# Patient Record
Sex: Male | Born: 1970 | Race: Black or African American | Hispanic: No | Marital: Married | Smoking: Current every day smoker
Health system: Southern US, Community
[De-identification: ages and names within clinical notes are randomized; demographics above are authoritative.]

## PROBLEM LIST (undated history)

## (undated) DIAGNOSIS — E785 Hyperlipidemia, unspecified: Secondary | ICD-10-CM

## (undated) DIAGNOSIS — K219 Gastro-esophageal reflux disease without esophagitis: Secondary | ICD-10-CM

## (undated) DIAGNOSIS — M722 Plantar fascial fibromatosis: Secondary | ICD-10-CM

## (undated) DIAGNOSIS — I2510 Atherosclerotic heart disease of native coronary artery without angina pectoris: Secondary | ICD-10-CM

## (undated) DIAGNOSIS — Z9289 Personal history of other medical treatment: Secondary | ICD-10-CM

## (undated) HISTORY — DX: Gastro-esophageal reflux disease without esophagitis: K21.9

## (undated) HISTORY — PX: OTHER SURGICAL HISTORY: SHX169

## (undated) HISTORY — DX: Hyperlipidemia, unspecified: E78.5

## (undated) HISTORY — DX: Atherosclerotic heart disease of native coronary artery without angina pectoris: I25.10

## (undated) HISTORY — DX: Plantar fascial fibromatosis: M72.2

## (undated) HISTORY — PX: CORONARY STENT PLACEMENT: SHX1402

## (undated) HISTORY — DX: Personal history of other medical treatment: Z92.89

---

## 2004-04-27 ENCOUNTER — Emergency Department (HOSPITAL_COMMUNITY): Admission: EM | Admit: 2004-04-27 | Discharge: 2004-04-27 | Payer: Self-pay

## 2006-09-11 ENCOUNTER — Emergency Department (HOSPITAL_COMMUNITY): Admission: EM | Admit: 2006-09-11 | Discharge: 2006-09-11 | Payer: Self-pay | Admitting: Emergency Medicine

## 2011-02-06 ENCOUNTER — Emergency Department (HOSPITAL_COMMUNITY): Payer: Self-pay

## 2011-02-06 ENCOUNTER — Inpatient Hospital Stay (HOSPITAL_COMMUNITY)
Admission: AD | Admit: 2011-02-06 | Discharge: 2011-02-08 | DRG: 249 | Disposition: A | Discharge status: Home / Self Care | Payer: Self-pay | Source: Other Acute Inpatient Hospital | Attending: Internal Medicine | Admitting: Internal Medicine

## 2011-02-06 ENCOUNTER — Emergency Department (HOSPITAL_COMMUNITY)
Admission: EM | Admit: 2011-02-06 | Discharge: 2011-02-06 | Disposition: A | Discharge status: Other Acute Inpatient Hospital | Payer: Self-pay | Source: Home / Self Care | Attending: Emergency Medicine | Admitting: Emergency Medicine

## 2011-02-06 DIAGNOSIS — F17200 Nicotine dependence, unspecified, uncomplicated: Secondary | ICD-10-CM | POA: Diagnosis present

## 2011-02-06 DIAGNOSIS — Z01810 Encounter for preprocedural cardiovascular examination: Secondary | ICD-10-CM

## 2011-02-06 DIAGNOSIS — I200 Unstable angina: Secondary | ICD-10-CM | POA: Diagnosis present

## 2011-02-06 DIAGNOSIS — F1210 Cannabis abuse, uncomplicated: Secondary | ICD-10-CM | POA: Diagnosis present

## 2011-02-06 DIAGNOSIS — Z01812 Encounter for preprocedural laboratory examination: Secondary | ICD-10-CM

## 2011-02-06 DIAGNOSIS — R079 Chest pain, unspecified: Secondary | ICD-10-CM

## 2011-02-06 DIAGNOSIS — I2510 Atherosclerotic heart disease of native coronary artery without angina pectoris: Principal | ICD-10-CM | POA: Diagnosis present

## 2011-02-06 LAB — COMPREHENSIVE METABOLIC PANEL
Alkaline Phosphatase: 71 U/L (ref 39–117)
CO2: 30 mEq/L (ref 19–32)
Chloride: 99 mEq/L (ref 96–112)
Creatinine, Ser: 1.42 mg/dL (ref 0.4–1.5)
GFR calc Af Amer: >60 mL/min (ref >60)
GFR calc non Af Amer: 55 mL/min — ABNORMAL LOW (ref >60)
Glucose, Bld: 81 mg/dL (ref 70–99)
Total Bilirubin: 0.6 mg/dL (ref 0.3–1.2)
Total Protein: 7.7 g/dL (ref 6.0–8.3)

## 2011-02-06 LAB — RAPID URINE DRUG SCREEN, HOSP PERFORMED: Tetrahydrocannabinol: POSITIVE — AB

## 2011-02-06 LAB — DIFFERENTIAL
Basophils Absolute: 0.1 10*3/uL (ref 0.0–0.1)
Basophils Relative: 1 % (ref 0–1)
Eosinophils Relative: 1 % (ref 0–5)
Lymphocytes Relative: 36 % (ref 12–46)
Monocytes Relative: 7 % (ref 3–12)
Neutrophils Relative %: 55 % (ref 43–77)

## 2011-02-06 LAB — CBC
HCT: 46 % (ref 39.0–52.0)
Hemoglobin: 15.5 g/dL (ref 13.0–17.0)
MCH: 30.9 pg (ref 26.0–34.0)
MCV: 91.8 fL (ref 78.0–100.0)
RBC: 5.01 MIL/uL (ref 4.22–5.81)

## 2011-02-06 LAB — LIPID PANEL
Cholesterol: 165 mg/dL (ref 0–200)
HDL: 65 mg/dL (ref >39)
Total CHOL/HDL Ratio: 2.5 RATIO
Triglycerides: 49 mg/dL (ref <150)
VLDL: 10 mg/dL (ref 0–40)

## 2011-02-06 LAB — HEMOGLOBIN A1C: Hgb A1c MFr Bld: 5.5 % (ref <5.7)

## 2011-02-06 LAB — CARDIAC PANEL(CRET KIN+CKTOT+MB+TROPI)
CK, MB: 1.7 ng/mL (ref 0.3–4.0)
Relative Index: 0.8 (ref 0.0–2.5)
Total CK: 215 U/L (ref 7–232)

## 2011-02-07 DIAGNOSIS — I2510 Atherosclerotic heart disease of native coronary artery without angina pectoris: Secondary | ICD-10-CM

## 2011-02-07 LAB — BASIC METABOLIC PANEL
BUN: 11 mg/dL (ref 6–23)
CO2: 27 mEq/L (ref 19–32)
Calcium: 8.4 mg/dL (ref 8.4–10.5)
Chloride: 103 mEq/L (ref 96–112)
GFR calc Af Amer: >60 mL/min (ref >60)
GFR calc non Af Amer: >60 mL/min (ref >60)
Glucose, Bld: 90 mg/dL (ref 70–99)
Potassium: 3.9 mEq/L (ref 3.5–5.1)

## 2011-02-07 LAB — CBC
MCH: 30.2 pg (ref 26.0–34.0)
Platelets: 206 10*3/uL (ref 150–400)
RBC: 4.96 MIL/uL (ref 4.22–5.81)
RDW: 14.7 % (ref 11.5–15.5)

## 2011-02-07 LAB — TSH: TSH: 2.392 u[IU]/mL (ref 0.350–4.500)

## 2011-02-07 LAB — PROTIME-INR: INR: 0.98 (ref 0.00–1.49)

## 2011-02-07 LAB — CARDIAC PANEL(CRET KIN+CKTOT+MB+TROPI)
Relative Index: 0.7 (ref 0.0–2.5)
Troponin I: 0.03 ng/mL (ref 0.00–0.06)

## 2011-02-08 DIAGNOSIS — I200 Unstable angina: Secondary | ICD-10-CM

## 2011-02-08 LAB — CBC
MCHC: 33.6 g/dL (ref 30.0–36.0)
RDW: 14.6 % (ref 11.5–15.5)

## 2011-02-08 LAB — BASIC METABOLIC PANEL
CO2: 28 mEq/L (ref 19–32)
Chloride: 106 mEq/L (ref 96–112)
Potassium: 3.8 mEq/L (ref 3.5–5.1)

## 2011-02-08 NOTE — Procedures (Signed)
NAME:  Sean Morris, Sean Morris NO.:  [id number]  MEDICAL RECORD NO.:  [id number]           PATIENT TYPE:  O  LOCATION:  6531                         FACILITY:  [facility]  PHYSICIAN:  Verne Carrow, MDDATE OF BIRTH:  October 30, 1971  DATE OF PROCEDURE:  02/07/2011 DATE OF DISCHARGE:                           CARDIAC CATHETERIZATION   PRIMARY CARDIOLOGIST:  Bevelyn Buckles. Bensimhon, MD  PROCEDURES PERFORMED: 1. Left heart catheterization 2. Selective coronary angiography. 3. Left ventricular angiogram. 4. Intravascular ultrasound of the mid left anterior descending     artery. 5. PTCA with placement of a bare-metal stent in the mid left anterior     descending artery. 6. Placement of Angio-Seal femoral artery closure device in the right     femoral artery.  OPERATOR:  Verne Carrow, MD  INDICATIONS:  This is a 40 year old African American male with a history of tobacco abuse and former illicit drug abuse who presents to the hospital with complaints of chest pain with minimal exertion.  Cardiac enzymes were negative.  Diagnostic catheterization was arranged for today.  PROCEDURE IN DETAIL:  The patient was brought to the Main Cardiac Catheterization Laboratory after signing informed consent for the procedure.  The right groin was prepped and draped in a sterile fashion. Lidocaine 1% was used for local anesthesia.  A 5-French sheath was inserted into the right femoral artery without difficulty.  Standard diagnostic catheters were used to perform selective coronary angiography.  A pigtail catheter was used to perform a left ventricular angiogram.  At this point we carefully looked at the hazy stenosis in the mid left anterior descending artery.  This appeared to be hazy and severely stenotic in several views.  Because of the uncertainty, we elected to proceed to perform intravascular ultrasound of this lesion. Sheath was upsized to a 6-French sheath.  A 6-French  XBLAD 3.5 guiding catheter was used to selectively engage the left main artery.  The patient was given a bolus of Angiomax and drip was started.  He had been previously loaded with Effient 60 mg x1 yesterday.  When ACT was greater than 200, we passed a cougar intracoronary wire down the length of the left anterior descending artery.  We then passed an intravascular ultrasound camera over the wire.  Imaging was performed of the mid stenosis.  The patient was found to have severe plaque disease in the mid vessel.  At this point we elected to proceed intervention of the severe stenosis in the mid left anterior descending artery.  A 2.5 x 12- mm balloon was used to predilate the lesion.  A 3.5 x 15-mm Vision bare- metal stent was carefully positioned in the midvessel and was deployed without difficulty.  A 4.0 x 12-mm noncompliant balloon was placed inside the stent was inflated without difficulty.  The stenosis was taken from 90% down to 0%.  The patient tolerated the procedure well. There were no immediate complications.  The Angiomax was stopped here in the cath lab.  The patient was taken to the recovery area in stable condition.  HEMODYNAMIC FINDINGS:  Central aortic pressure 135/75.  Left ventricular pressure 142/7/21.  ANGIOGRAPHIC FINDINGS: 1. The left main coronary artery had no evidence of disease. 2. The left anterior descending was a large vessel that coursed to the     apex and gave off 3 moderate-sized diagonal branches.  The mid LAD     just after the takeoff of the first diagonal branch had a hazy     severe 90% stenosis.  The diagonal branches had mild plaque     disease. 3. The circumflex artery gave off a moderate-sized first obtuse     marginal branch that had no disease.  The AV groove circumflex     became a small-caliber vessel after the takeoff of a large second     obtuse marginal branch.  There was no disease in this vessel. 4. The right coronary artery is a  large dominant vessel with no     disease. 5. Left ventricular angiogram was performed in the RAO projection that     showed normal left ventricular systolic function with ejection     fraction of 50-55%.  IMPRESSION: 1. Single-vessel coronary artery disease. 2. Successful percutaneous transluminal coronary angioplasty with     placement of bare-metal stent in the mid left anterior descending     artery. 3. Normal left ventricular systolic function.  RECOMMENDATIONS:  The patient should be continued on aspirin and Effient for at least 1 month.  We will continue his beta-blocker and his statin as written.  We will watch him closely night and discharge him home tomorrow if he is stable.     Verne Carrow, MD     CM/MEDQ  D:  02/07/2011  T:  02/08/2011  Job:  [id number]  cc:   Bevelyn Buckles. Bensimhon, MD  Electronically Signed by Verne Carrow MD on 02/08/2011 10:01:25 AM

## 2011-02-09 ENCOUNTER — Emergency Department (HOSPITAL_COMMUNITY)
Admission: EM | Admit: 2011-02-09 | Discharge: 2011-02-09 | Disposition: A | Discharge status: Home / Self Care | Payer: Self-pay | Attending: Emergency Medicine | Admitting: Emergency Medicine

## 2011-02-09 DIAGNOSIS — I724 Aneurysm of artery of lower extremity: Secondary | ICD-10-CM

## 2011-02-09 DIAGNOSIS — R229 Localized swelling, mass and lump, unspecified: Secondary | ICD-10-CM | POA: Insufficient documentation

## 2011-02-09 DIAGNOSIS — IMO0002 Reserved for concepts with insufficient information to code with codable children: Secondary | ICD-10-CM | POA: Insufficient documentation

## 2011-02-09 DIAGNOSIS — I2510 Atherosclerotic heart disease of native coronary artery without angina pectoris: Secondary | ICD-10-CM | POA: Insufficient documentation

## 2011-02-09 DIAGNOSIS — Y840 Cardiac catheterization as the cause of abnormal reaction of the patient, or of later complication, without mention of misadventure at the time of the procedure: Secondary | ICD-10-CM | POA: Insufficient documentation

## 2011-02-09 LAB — CBC
HCT: 42.9 % (ref 39.0–52.0)
Hemoglobin: 14.7 g/dL (ref 13.0–17.0)
MCH: 30.8 pg (ref 26.0–34.0)
MCHC: 34.3 g/dL (ref 30.0–36.0)
MCV: 89.9 fL (ref 78.0–100.0)
Platelets: 194 10*3/uL (ref 150–400)
RBC: 4.77 MIL/uL (ref 4.22–5.81)
RDW: 14.4 % (ref 11.5–15.5)
WBC: 9.5 10*3/uL (ref 4.0–10.5)

## 2011-02-16 NOTE — Discharge Summary (Signed)
NAME:  Sean Morris, Sean Morris NO.:  [id number]  MEDICAL RECORD NO.:  [id number]           PATIENT TYPE:  O  LOCATION:  6531                         FACILITY:  [facility]  PHYSICIAN:  Verne Carrow, MDDATE OF BIRTH:  11-19-1970  DATE OF ADMISSION:  02/06/2011 DATE OF DISCHARGE:  02/08/2011                              DISCHARGE SUMMARY   PRIMARY CARDIOLOGIST:  Verne Carrow, MD  DISCHARGE DIAGNOSIS:  Unstable angina.  SECONDARY DIAGNOSES: 1. Coronary artery disease status post successful percutaneous     coronary intervention and bare-metal stenting to left anterior     descending (coronary) artery this admission. 2. Ongoing tobacco abuse. 3. Ongoing marijuana abuse. 4. History of hemorrhoids.  ALLERGIES:  No known drug allergies.  PROCEDURES: 1. Left heart cardiac catheterization revealing a 90% hazy stenosis in     the mid left anterior descending (coronary) artery with otherwise     nonobstructive coronary artery disease.  Ejection fraction was 50-     55%. 2. Successful percutaneous coronary intervention and stenting of the     mid left anterior descending (coronary) artery with placement of a     3.5 x 15-mm Vision bare-metal stent.  HISTORY OF PRESENT ILLNESS:  A 40 year old male without prior cardiac history who was in his usual state of health until several days prior to admission who began to experience exertional dyspnea that worsened on the day of admission.  He presented to the [facility] ED on February 06, 2011, where ECG was nonacute and point-of-care markers were negative. He was admitted for evaluation.  HOSPITAL COURSE:  The patient subsequently ruled out for MI.  History was felt to be concerning for unstable angina and decision was made to pursue cardiac catheterization.  Diagnostic catheterization on February 07, 2011, revealed 90% hazy stenosis in the mid LAD with otherwise nonobstructive disease and normal LV function.  The LAD was  successfully stented with a 3.5 x 15-mm Vision bare-metal stent.  The patient tolerated this procedure well and has been maintained on aspirin, Effient, and high-dose statin therapy.  He was initially placed on beta- blocker therapy, but we have discontinued this in the setting of baseline bradycardia.  The patient has been counseled on the importance of smoking cessation, is also been seen by Cardiac Rehab with outpatient referral made.  We have worked with case management to obtain Effient assistance and the patient will be discharged home today in good condition.  DISCHARGE LABORATORIES:  Hemoglobin 13.6, hematocrit 40.5, WBC 7.9, platelets 188, INR is 0.98.  Sodium 139, potassium 3.8, chloride 106, CO2 28, BUN 6, creatinine 0.95, glucose 87.  Total bilirubin 0.6, alkaline phosphatase 71, AST 34, ALT 21, total protein 7.7, albumin 4.4, calcium 8.3.  Hemoglobin A1c 5.5.  CK 217, MB 1.5, troponin-I 0.03. Total cholesterol 165, triglycerides 49, HDL 65, LDL 90.  TSH 2.392. Urine drug screen is positive for THC.  MRSA screen was negative.  DISPOSITION:  The patient will be discharged home today in good condition.  FOLLOWUP PLANS AND APPOINTMENTS:  We have arranged the patient to follow up with Dr. Clifton James on Mar 03, 2011, at 3:45 p.m.  DISCHARGE MEDICATIONS: 1. Aspirin 81 mg daily. 2. Nitroglycerin 0.4 mg sublingual p.r.n. chest pain. 3. Pravachol 80 mg at bedtime. 4. Prasugrel 10 mg daily.  OUTSTANDING LABORATORY STUDIES:  The patient will require followup lipids and LFTs in 6-8 weeks given new statin therapy.  DURATION OF DISCHARGE ENCOUNTER:  35 minutes including physician time.     Nicolasa Ducking, ANP   ______________________________ Verne Carrow, MD    CB/MEDQ  D:  02/08/2011  T:  02/09/2011  Job:  [id number]  Electronically Signed by Nicolasa Ducking ANP on 02/14/2011 03:04:15 PM Electronically Signed by Verne Carrow MD on 02/16/2011 01:58:15  PM

## 2011-03-01 ENCOUNTER — Encounter: Payer: Self-pay | Admitting: Cardiovascular Disease

## 2011-03-01 NOTE — H&P (Signed)
NAME:  Sean Morris, Sean Morris NO.:  [id number]  MEDICAL RECORD NO.:  [id number]           PATIENT TYPE:  E  LOCATION:  WLED                         FACILITY:  [facility]  PHYSICIAN:  Bevelyn Buckles. Avangeline Stockburger, MDDATE OF BIRTH:  1971-01-31  DATE OF ADMISSION:  02/06/2011 DATE OF DISCHARGE:                             HISTORY & PHYSICAL   PRIMARY CARE PHYSICIAN:  None.  CARDIOLOGIST:  None.  REASON FOR ADMISSION:  Chest pain concerning for unstable angina.  HISTORY OF PRESENT ILLNESS:  Sean Morris is a 40 year old male with a history of ongoing tobacco use.  He denies any history of known coronary artery disease, hypertension, hyperlipidemia or diabetes.  He really has not followed up with a doctor for many years, except to say that he goes to the [facility] clinic once a year for his routine checkup.  He endorses a several-day history of exertional dyspnea and chest pain. He said he had the first episode on Monday when he was walking to the bathroom; this was just very brief and resolved.  However, since that time he has had multiple episodes including on Thursday during sexual intercourse with his girlfriend.  He did not say anything at that time. He recovered from this after a few minutes.  Today, he was walking in from his car when he got central chest pressure and diaphoretic.  He said the discomfort radiated to his neck and he could not catch his breath.  He sat down on the bed for about 5 to 10 minutes and it finally resolved.  He told his girlfriend.  She suggested he come to the ER.  He came to the ER today.  His chest is pain free.  His EKG was normal.  His labs are currently pending.  REVIEW OF SYSTEMS:  On review of systems he denies any drug use except for occasional marijuana.  He does smoke about 6 cigarettes a day as well as 3 thin cigars.  He says he has occasional hemorrhoidal bleeding but nothing severe.  No recent fevers or chills.  No focal neurologic symptoms.   No claudication.  Remainder of review of systems, all systems are negative except for HPI and problem list.  PROBLEM LIST: 1. Hemorrhoids. 2. Tobacco use, ongoing.  MEDICATIONS:  None.  SOCIAL HISTORY:  He previously worked with the [employer] but stopped working with them in March for unclear reasons. He smokes cigarettes about 6 cigarettes a day and 3 cigars a day.  He says he drinks alcohol just on social occasions.  Denies IV drug use or cocaine.  Does endorse occasional marijuana use.  FAMILY HISTORY:  Mother is alive at age 79.  She has a history of diabetes.  No known heart disease.  Father is 35.  He thinks he is in good health but does not know for sure.  He denies any family history of premature coronary artery disease.  He does have 3 brothers and 1 sister all of whom he says are healthy.  PHYSICAL EXAMINATION:  GENERAL:  He is well-appearing in no acute distress.  VITAL SIGNS:  Respirations are  unlabored.  Blood pressure was 138/83 with a heart rate of 73.  Saturations are 96% on room air. HEENT:  Normal.  NECK:  Supple.  No JVD.  Carotid are 2+ bilaterally without bruits.  There is no lymphadenopathy or thyromegaly.  HEART: PMI is nondisplaced.  It is regular with no murmurs, rubs or gallops. LUNGS:  Clear.  ABDOMEN:  Soft, nontender, nondistended.  No hepatosplenomegaly.  No bruits.  No masses.  Good bowel sounds. EXTREMITIES:  Warm with no cyanosis, clubbing or edema.  There are good distal pulses.  No rash.  NEUROLOGIC:  Alert and oriented x3.  Cranial nerves II-XII are intact.  Moves all four extremities without difficulty.  Affect is pleasant.  EKG shows normal sinus rhythm at a rate of 70.  No acute ST-T wave abnormalities.  He does have small Q-waves in the high lateral leads of uncertain significance.  Labs are currently pending.  ASSESSMENT: 1. Chest pain, concerning for unstable angina. 2. Abnormal EKG with small Q-waves in the  high lateral leads.  No     acute ST-T wave abnormalities. 3. Tobacco use, ongoing. 4. Occasional alcohol use.  PLAN/DISCUSSION:  I have reviewed the situation with Sean Morris and his girlfriend.  I think his chest pain is very concerning for unstable angina.  Will admit him to rule out myocardial infarction with serial cardiac markers.  He will be treated with aspirin, beta-blocker, nitroglycerin, statin, Effient, and Lovenox.  The risks and indications of cath were discussed at length.  These include allergic reaction, renal dysfunction, stroke, heart attack, vascular damage, bleeding and death.  He agrees to proceed with cardiac catheterization in the morning.     Bevelyn Buckles. Eligah Anello, MD     DRB/MEDQ  D:  02/06/2011  T:  02/06/2011  Job:  [id number]  Electronically Signed by Arvilla Meres MD on 03/01/2011 07:14:47 PM

## 2011-03-03 ENCOUNTER — Encounter: Payer: Self-pay | Admitting: Cardiovascular Disease

## 2011-03-10 ENCOUNTER — Telehealth: Payer: Self-pay | Admitting: Cardiology

## 2011-03-10 NOTE — Telephone Encounter (Signed)
Pt needs previstatin 40mg  qd/hs and  Prasugrel 10mg  qd called into [facility] on [address]

## 2011-03-11 ENCOUNTER — Telehealth: Payer: Self-pay | Admitting: Cardiovascular Disease

## 2011-03-11 NOTE — Telephone Encounter (Signed)
Advised patient he needs to take his Effient. He is currently in the process of applying for the orange card. I will leave samples at the front for him along with the assistance program application.

## 2011-03-11 NOTE — Telephone Encounter (Signed)
Went to phar to pick up Effient and it was not called in. Pt has rescheduled for June appt.  Does he need to go without Effient until then or do you want him to take ASA instead.  [facility] is [facility] on [address].

## 2011-04-05 ENCOUNTER — Encounter: Payer: Self-pay | Admitting: Cardiovascular Disease

## 2011-04-05 ENCOUNTER — Ambulatory Visit (INDEPENDENT_AMBULATORY_CARE_PROVIDER_SITE_OTHER): Payer: Self-pay | Admitting: Cardiovascular Disease

## 2011-04-05 VITALS — BP 125/74 | HR 53 | Resp 12 | Ht 72.0 in | Wt 145.0 lb

## 2011-04-05 DIAGNOSIS — I2510 Atherosclerotic heart disease of native coronary artery without angina pectoris: Secondary | ICD-10-CM

## 2011-04-05 NOTE — Patient Instructions (Signed)
Your physician recommends that you schedule a follow-up appointment in: 6 months with Dr. McAlhany.  

## 2011-04-05 NOTE — Assessment & Plan Note (Signed)
Stable post PCI April, bare metal stent LAD. Will continue ASA 81 mg once daily. He will continue Effient 10 mg once daily for 6 more weeks and will then stop. Continue statin.

## 2011-04-05 NOTE — Progress Notes (Signed)
History of Present Illness:39 yo AAM with history of tobacco abuse and newly diagnosed CAD admitted to [facility] 02/07/11 with c/o chest pain and found to have severe mid LAD stenosis. A 3.5 x 15 mm Vision bare metal stent was placed after IVUS confirmed the severity of the lesion. The other vessels were free of disease. LV function was normal.  He is here today for hospital follow up. He is feeling well. No chest pain, SOB, palpitations, near syncope or syncope.   Past Medical History  Diagnosis Date  . CAD (coronary artery disease)     s/p stent  . Hemorrhoids     Past Surgical History  Procedure Date  . Coronary stent placement     Current Outpatient Prescriptions  Medication Sig Dispense Refill  . aspirin 81 MG tablet Take 81 mg by mouth daily.        . prasugrel (EFFIENT) 10 MG TABS 1 tab po qd       . pravastatin (PRAVACHOL) 40 MG tablet Take 40 mg by mouth daily.          No Known Allergies  History   Social History  . Marital Status: Single    Spouse Name: N/A    Number of Children: N/A  . Years of Education: N/A   Occupational History  . Not on file.   Social History Main Topics  . Smoking status: Former Games developer  . Smokeless tobacco: Not on file  . Alcohol Use: Yes     social  . Drug Use: Yes    Special: Marijuana  . Sexually Active: Not on file   Other Topics Concern  . Not on file   Social History Narrative  . No narrative on file    Family History  Problem Relation Age of Onset  . Diabetes      Review of Systems:  As stated in the HPI and otherwise negative.   BP 125/74  Pulse 53  Resp 12  Ht 6' (1.829 m)  Wt 145 lb (65.772 kg)  BMI 19.67 kg/m2  Physical Examination: General: Well developed, well nourished, NAD HEENT: OP clear, mucus membranes moist SKIN: warm, dry. No rashes. Neuro: No focal deficits Musculoskeletal: Muscle strength 5/5 all ext Psychiatric: Mood and affect normal Neck: No JVD, no carotid bruits, no  thyromegaly, no lymphadenopathy. Lungs:Clear bilaterally, no wheezes, rhonci, crackles Cardiovascular: Regular rate and rhythm. No murmurs, gallops or rubs. Abdomen:Soft. Bowel sounds present. Non-tender.  Extremities: No lower extremity edema. Pulses are 2 + in the bilateral DP/PT.  Cardiac Cath 02/07/11:

## 2011-04-14 ENCOUNTER — Telehealth: Payer: Self-pay | Admitting: Cardiovascular Disease

## 2011-04-14 NOTE — Telephone Encounter (Signed)
Patient states that his right leg is numb but his foot his warm to the touch with no discoloration. His leg is not painful but he is concerned b/c his cardiac catheterization was done through his right groin. Explained to him that I do not think this is a problem and may be a pinched nerve but I will route to Dr. Clifton James to review. His groin site looks good with a small hematoma that is unchanged from discharge.

## 2011-04-14 NOTE — Telephone Encounter (Signed)
Patient is aware of test/lab results.  

## 2011-04-14 NOTE — Telephone Encounter (Signed)
Pt calling re concerns with leg, numbness and tingling

## 2011-04-14 NOTE — Telephone Encounter (Signed)
I agree. Probably just some femoral nerve irritation or totally unrelated to the cath. If it numbness persists, he should call and let us know. Thanks, chris

## 2011-05-06 ENCOUNTER — Emergency Department (HOSPITAL_COMMUNITY)
Admission: EM | Admit: 2011-05-06 | Discharge: 2011-05-06 | Disposition: A | Discharge status: Home / Self Care | Payer: Self-pay | Attending: Emergency Medicine | Admitting: Emergency Medicine

## 2011-05-06 DIAGNOSIS — L02619 Cutaneous abscess of unspecified foot: Secondary | ICD-10-CM | POA: Insufficient documentation

## 2011-05-06 DIAGNOSIS — L03039 Cellulitis of unspecified toe: Secondary | ICD-10-CM | POA: Insufficient documentation

## 2011-05-06 DIAGNOSIS — E7800 Pure hypercholesterolemia, unspecified: Secondary | ICD-10-CM | POA: Insufficient documentation

## 2011-05-08 ENCOUNTER — Emergency Department (HOSPITAL_COMMUNITY)
Admission: EM | Admit: 2011-05-08 | Discharge: 2011-05-08 | Disposition: A | Discharge status: Home / Self Care | Payer: Self-pay | Attending: Emergency Medicine | Admitting: Emergency Medicine

## 2011-05-08 DIAGNOSIS — E7800 Pure hypercholesterolemia, unspecified: Secondary | ICD-10-CM | POA: Insufficient documentation

## 2011-05-08 DIAGNOSIS — L03039 Cellulitis of unspecified toe: Secondary | ICD-10-CM | POA: Insufficient documentation

## 2011-05-08 DIAGNOSIS — Z09 Encounter for follow-up examination after completed treatment for conditions other than malignant neoplasm: Secondary | ICD-10-CM | POA: Insufficient documentation

## 2011-05-08 DIAGNOSIS — L02619 Cutaneous abscess of unspecified foot: Secondary | ICD-10-CM | POA: Insufficient documentation

## 2011-06-15 ENCOUNTER — Telehealth: Payer: Self-pay | Admitting: Cardiovascular Disease

## 2011-06-15 NOTE — Telephone Encounter (Signed)
Pt wants to know since completing the Effient, does he need to continue the ASA?  Please call him with directions.

## 2011-06-15 NOTE — Telephone Encounter (Signed)
Informed patient to continue taking Aspirin 81 mg daily.

## 2011-10-03 ENCOUNTER — Ambulatory Visit (INDEPENDENT_AMBULATORY_CARE_PROVIDER_SITE_OTHER): Payer: Self-pay | Admitting: Cardiovascular Disease

## 2011-10-03 ENCOUNTER — Encounter: Payer: Self-pay | Admitting: Cardiovascular Disease

## 2011-10-03 ENCOUNTER — Encounter: Payer: Self-pay | Admitting: *Deleted

## 2011-10-03 VITALS — BP 124/75 | HR 66 | Ht 72.0 in | Wt 149.0 lb

## 2011-10-03 DIAGNOSIS — I2510 Atherosclerotic heart disease of native coronary artery without angina pectoris: Secondary | ICD-10-CM

## 2011-10-03 NOTE — Assessment & Plan Note (Signed)
Stable. He is on an ASA and a statin. Will check lipids and LFTs. No changes.

## 2011-10-03 NOTE — Progress Notes (Signed)
   History of Present Illness:40 yo AAM with history of tobacco abuse and CAD here today for cardiac follow up. He was admitted to [facility] 02/07/11 with c/o chest pain and found to have severe mid LAD stenosis. A 3.5 x 15 mm Vision bare metal stent was placed after IVUS confirmed the severity of the lesion. The other vessels were free of disease. LV function was normal. He is here today for follow up. He is feeling well. No chest pain, SOB, palpitations, near syncope or syncope.    Past Medical History  Diagnosis Date  . CAD (coronary artery disease)     s/p 3.5 x 15 mm bare metal stent mid LAD April 2012  . Hemorrhoids     Past Surgical History  Procedure Date  . Coronary stent placement   . Plantar fasciitis surgery     Current Outpatient Prescriptions  Medication Sig Dispense Refill  . aspirin 81 MG tablet Take 81 mg by mouth daily.        . Multiple Vitamins-Minerals (MULTIVITAMIN PO) Take by mouth daily.        . pravastatin (PRAVACHOL) 40 MG tablet Take 40 mg by mouth daily.          No Known Allergies  History   Social History  . Marital Status: Single    Spouse Name: N/A    Number of Children: N/A  . Years of Education: N/A   Occupational History  . Not on file.   Social History Main Topics  . Smoking status: Former Games developer  . Smokeless tobacco: Not on file  . Alcohol Use: Yes     social  . Drug Use: Yes    Special: Marijuana  . Sexually Active: Not on file   Other Topics Concern  . Not on file   Social History Narrative  . No narrative on file    Family History  Problem Relation Age of Onset  . Diabetes      Review of Systems:  As stated in the HPI and otherwise negative.   BP 124/75  Pulse 66  Ht 6' (1.829 m)  Wt 149 lb (67.586 kg)  BMI 20.21 kg/m2  Physical Examination: General: Well developed, well nourished, NAD HEENT: OP clear, mucus membranes moist SKIN: warm, dry. No rashes. Neuro: No focal deficits Musculoskeletal:  Muscle strength 5/5 all ext Psychiatric: Mood and affect normal Neck: No JVD, no carotid bruits, no thyromegaly, no lymphadenopathy. Lungs:Clear bilaterally, no wheezes, rhonci, crackles Cardiovascular: Regular rate and rhythm. No murmurs, gallops or rubs. Abdomen:Soft. Bowel sounds present. Non-tender.  Extremities: No lower extremity edema. Pulses are 2 + in the bilateral DP/PT.

## 2011-10-03 NOTE — Patient Instructions (Signed)
Your physician wants you to follow-up in: 12 months.  You will receive a reminder letter in the mail two months in advance. If you don't receive a letter, please call our office to schedule the follow-up appointment.  Your physician recommends that you return for fasting lab work later this week--Lipid and Liver profile.

## 2011-10-05 ENCOUNTER — Other Ambulatory Visit (INDEPENDENT_AMBULATORY_CARE_PROVIDER_SITE_OTHER): Payer: Self-pay | Admitting: *Deleted

## 2011-10-05 DIAGNOSIS — I2510 Atherosclerotic heart disease of native coronary artery without angina pectoris: Secondary | ICD-10-CM

## 2011-10-05 LAB — HEPATIC FUNCTION PANEL
ALT: 25 U/L (ref 0–53)
AST: 31 U/L (ref 0–37)
Albumin: 4 g/dL (ref 3.5–5.2)
Alkaline Phosphatase: 75 U/L (ref 39–117)
Total Protein: 7 g/dL (ref 6.0–8.3)

## 2011-10-05 LAB — LIPID PANEL
Cholesterol: 147 mg/dL (ref 0–200)
HDL: 61.5 mg/dL (ref >39.00)
Total CHOL/HDL Ratio: 2
Triglycerides: 38 mg/dL (ref 0.0–149.0)

## 2011-10-06 ENCOUNTER — Other Ambulatory Visit: Payer: Self-pay | Admitting: *Deleted

## 2012-04-26 ENCOUNTER — Encounter: Payer: Self-pay | Admitting: Internal Medicine

## 2012-04-26 ENCOUNTER — Ambulatory Visit (INDEPENDENT_AMBULATORY_CARE_PROVIDER_SITE_OTHER): Payer: 59 | Admitting: Internal Medicine

## 2012-04-26 ENCOUNTER — Other Ambulatory Visit (INDEPENDENT_AMBULATORY_CARE_PROVIDER_SITE_OTHER): Payer: 59

## 2012-04-26 VITALS — BP 108/74 | HR 56 | Temp 98.1°F | Resp 16 | Ht 72.0 in | Wt 146.5 lb

## 2012-04-26 DIAGNOSIS — Z23 Encounter for immunization: Secondary | ICD-10-CM

## 2012-04-26 DIAGNOSIS — Z0000 Encounter for general adult medical examination without abnormal findings: Secondary | ICD-10-CM

## 2012-04-26 DIAGNOSIS — I2510 Atherosclerotic heart disease of native coronary artery without angina pectoris: Secondary | ICD-10-CM

## 2012-04-26 DIAGNOSIS — K921 Melena: Secondary | ICD-10-CM

## 2012-04-26 DIAGNOSIS — E7800 Pure hypercholesterolemia, unspecified: Secondary | ICD-10-CM | POA: Insufficient documentation

## 2012-04-26 LAB — LIPID PANEL
Cholesterol: 166 mg/dL (ref 0–200)
LDL Cholesterol: 85 mg/dL (ref 0–99)
VLDL: 15.8 mg/dL (ref 0.0–40.0)

## 2012-04-26 LAB — CBC WITH DIFFERENTIAL/PLATELET
Basophils Absolute: 0.1 10*3/uL (ref 0.0–0.1)
Eosinophils Relative: 2.3 % (ref 0.0–5.0)
HCT: 48.3 % (ref 39.0–52.0)
Hemoglobin: 15.8 g/dL (ref 13.0–17.0)
Lymphs Abs: 3.1 10*3/uL (ref 0.7–4.0)
MCV: 91.8 fl (ref 78.0–100.0)
Monocytes Absolute: 0.6 10*3/uL (ref 0.1–1.0)
Neutro Abs: 5 10*3/uL (ref 1.4–7.7)
Platelets: 234 10*3/uL (ref 150.0–400.0)
RDW: 16.3 % — ABNORMAL HIGH (ref 11.5–14.6)

## 2012-04-26 LAB — COMPREHENSIVE METABOLIC PANEL
ALT: 19 U/L (ref 0–53)
Alkaline Phosphatase: 76 U/L (ref 39–117)
Creatinine, Ser: 1.1 mg/dL (ref 0.4–1.5)
Sodium: 138 mEq/L (ref 135–145)
Total Bilirubin: 0.7 mg/dL (ref 0.3–1.2)
Total Protein: 7.1 g/dL (ref 6.0–8.3)

## 2012-04-26 LAB — URINALYSIS, ROUTINE W REFLEX MICROSCOPIC
Bilirubin Urine: NEGATIVE
Leukocytes, UA: NEGATIVE
Nitrite: NEGATIVE
Total Protein, Urine: NEGATIVE

## 2012-04-26 LAB — TSH: TSH: 1.31 u[IU]/mL (ref 0.35–5.50)

## 2012-04-26 MED ORDER — ROSUVASTATIN CALCIUM 5 MG PO TABS: 5.0000 mg | ORAL_TABLET | Freq: Every day | ORAL | Status: DC

## 2012-04-26 NOTE — Assessment & Plan Note (Signed)
Exam done, vaccines were updated, labs ordered, pt ed material was given 

## 2012-04-26 NOTE — Assessment & Plan Note (Signed)
Start crestor, recheck FLP today

## 2012-04-26 NOTE — Patient Instructions (Signed)
Health Maintenance, Males A healthy lifestyle and preventative care can promote health and wellness.  Maintain regular health, dental, and eye exams.   Eat a healthy diet. Foods like vegetables, fruits, whole grains, low-fat dairy products, and lean protein foods contain the nutrients you need without too many calories. Decrease your intake of foods high in solid fats, added sugars, and salt. Get information about a proper diet from your caregiver, if necessary.   Regular physical exercise is one of the most important things you can do for your health. Most adults should get at least 150 minutes of moderate-intensity exercise (any activity that increases your heart rate and causes you to sweat) each week. In addition, most adults need muscle-strengthening exercises on 2 or more days a week.    Maintain a healthy weight. The body mass index (BMI) is a screening tool to identify possible weight problems. It provides an estimate of body fat based on height and weight. Your caregiver can help determine your BMI, and can help you achieve or maintain a healthy weight. For adults 20 years and older:   A BMI below 18.5 is considered underweight.   A BMI of 18.5 to 24.9 is normal.   A BMI of 25 to 29.9 is considered overweight.   A BMI of 30 and above is considered obese.   Maintain normal blood lipids and cholesterol by exercising and minimizing your intake of saturated fat. Eat a balanced diet with plenty of fruits and vegetables. Blood tests for lipids and cholesterol should begin at age 20 and be repeated every 5 years. If your lipid or cholesterol levels are high, you are over 50, or you are a high risk for heart disease, you may need your cholesterol levels checked more frequently.Ongoing high lipid and cholesterol levels should be treated with medicines, if diet and exercise are not effective.   If you smoke, find out from your caregiver how to quit. If you do not use tobacco, do not start.    If you choose to drink alcohol, do not exceed 2 drinks per day. One drink is considered to be 12 ounces (355 mL) of beer, 5 ounces (148 mL) of wine, or 1.5 ounces (44 mL) of liquor.   Avoid use of street drugs. Do not share needles with anyone. Ask for help if you need support or instructions about stopping the use of drugs.   High blood pressure causes heart disease and increases the risk of stroke. Blood pressure should be checked at least every 1 to 2 years. Ongoing high blood pressure should be treated with medicines if weight loss and exercise are not effective.   If you are 45 to 42 years old, ask your caregiver if you should take aspirin to prevent heart disease.   Diabetes screening involves taking a blood sample to check your fasting blood sugar level. This should be done once every 3 years, after age 45, if you are within normal weight and without risk factors for diabetes. Testing should be considered at a younger age or be carried out more frequently if you are overweight and have at least 1 risk factor for diabetes.   Colorectal cancer can be detected and often prevented. Most routine colorectal cancer screening begins at the age of 50 and continues through age 75. However, your caregiver may recommend screening at an earlier age if you have risk factors for colon cancer. On a yearly basis, your caregiver may provide home test kits to check for hidden   blood in the stool. Use of a small camera at the end of a tube, to directly examine the colon (sigmoidoscopy or colonoscopy), can detect the earliest forms of colorectal cancer. Talk to your caregiver about this at age 50, when routine screening begins. Direct examination of the colon should be repeated every 5 to 10 years through age 75, unless early forms of pre-cancerous polyps or small growths are found.   Hepatitis C blood testing is recommended for all people born from 1945 through 1965 and any individual with known risks for  hepatitis C.   Healthy men should no longer receive prostate-specific antigen (PSA) blood tests as part of routine cancer screening. Consult with your caregiver about prostate cancer screening.   Testicular cancer screening is not recommended for adolescents or adult males who have no symptoms. Screening includes self-exam, caregiver exam, and other screening tests. Consult with your caregiver about any symptoms you have or any concerns you have about testicular cancer.   Practice safe sex. Use condoms and avoid high-risk sexual practices to reduce the spread of sexually transmitted infections (STIs).   Use sunscreen with a sun protection factor (SPF) of 30 or greater. Apply sunscreen liberally and repeatedly throughout the day. You should seek shade when your shadow is shorter than you. Protect yourself by wearing long sleeves, pants, a wide-brimmed hat, and sunglasses year round, whenever you are outdoors.   Notify your caregiver of new moles or changes in moles, especially if there is a change in shape or color. Also notify your caregiver if a mole is larger than the size of a pencil eraser.   A one-time screening for abdominal aortic aneurysm (AAA) and surgical repair of large AAAs by sound wave imaging (ultrasonography) is recommended for ages 65 to 75 years who are current or former smokers.   Stay current with your immunizations.  Document Released: 04/14/2008 Document Revised: 10/06/2011 Document Reviewed: 03/14/2011 ExitCare Patient Information 2012 [employer]. 

## 2012-04-26 NOTE — Progress Notes (Signed)
Subjective:    Patient ID: Sean Morris, male    DOB: October 19, 1971, 41 y.o.   MRN: [id number]  Gastrophageal Reflux He complains of belching and heartburn. He reports no abdominal pain, no chest pain, no choking, no coughing, no dysphagia, no early satiety, no globus sensation, no hoarse voice, no nausea, no sore throat, no stridor, no tooth decay, no water brash or no wheezing. This is a chronic problem. The current episode started more than 1 year ago. The problem occurs occasionally. The problem has been unchanged. The heartburn does not wake him from sleep. The heartburn does not limit his activity. The heartburn doesn't change with position. The symptoms are aggravated by smoking and ETOH. Associated symptoms include melena (intermittent blood in his stool). Pertinent negatives include no anemia, fatigue, muscle weakness, orthopnea or weight loss. Risk factors include ETOH use and smoking/tobacco exposure. Treatments tried: tums. The treatment provided moderate relief.  Hyperlipidemia This is a chronic problem. The current episode started more than 1 year ago. The problem is uncontrolled. Recent lipid tests were reviewed and are variable. He has no history of chronic renal disease, diabetes, hypothyroidism, liver disease or obesity. There are no known factors aggravating his hyperlipidemia. Pertinent negatives include no chest pain, focal sensory loss, focal weakness, leg pain, myalgias or shortness of breath. He is currently on no antihyperlipidemic treatment.      Review of Systems  Constitutional: Negative for fever, chills, weight loss, diaphoresis, activity change, appetite change, fatigue and unexpected weight change.  HENT: Negative.  Negative for sore throat and hoarse voice.   Eyes: Negative.   Respiratory: Negative for apnea, cough, choking, chest tightness, shortness of breath, wheezing and stridor.   Cardiovascular: Negative for chest pain, palpitations and leg swelling.    Gastrointestinal: Positive for heartburn, blood in stool and melena (intermittent blood in his stool). Negative for dysphagia, nausea, vomiting, abdominal pain, diarrhea, constipation, abdominal distention, anal bleeding and rectal pain.  Genitourinary: Negative.   Musculoskeletal: Negative for myalgias, back pain, joint swelling, arthralgias, gait problem and muscle weakness.  Skin: Negative for color change, pallor, rash and wound.  Neurological: Negative for dizziness, tremors, focal weakness, seizures, syncope, facial asymmetry, speech difficulty, weakness, light-headedness, numbness and headaches.  Hematological: Negative for adenopathy. Does not bruise/bleed easily.  Psychiatric/Behavioral: Negative.        Objective:   Physical Exam  Vitals reviewed. Constitutional: He is oriented to person, place, and time. He appears well-developed and well-nourished. No distress.  HENT:  Head: Normocephalic and atraumatic.  Mouth/Throat: Oropharynx is clear and moist. No oropharyngeal exudate.  Eyes: Conjunctivae are normal. Right eye exhibits no discharge. Left eye exhibits no discharge. No scleral icterus.  Neck: Normal range of motion. Neck supple. No JVD present. No tracheal deviation present. No thyromegaly present.  Cardiovascular: Normal rate, regular rhythm, normal heart sounds and intact distal pulses.  Exam reveals no gallop and no friction rub.   No murmur heard. Pulmonary/Chest: Effort normal and breath sounds normal. No stridor. No respiratory distress. He has no wheezes. He has no rales. He exhibits no tenderness.  Abdominal: Soft. Bowel sounds are normal. He exhibits no distension and no mass. There is no tenderness. There is no rebound and no guarding. Hernia confirmed negative in the right inguinal area and confirmed negative in the left inguinal area.  Genitourinary: Rectum normal, prostate normal, testes normal and penis normal. Rectal exam shows no external hemorrhoid, no  internal hemorrhoid, no fissure, no mass, no tenderness and anal tone  normal. Guaiac negative stool. Prostate is not enlarged and not tender. Right testis shows no mass, no swelling and no tenderness. Right testis is descended. Left testis shows no mass, no swelling and no tenderness. Left testis is descended. Circumcised. No penile tenderness. No discharge found.  Musculoskeletal: Normal range of motion. He exhibits no edema and no tenderness.  Lymphadenopathy:    He has no cervical adenopathy.       Right: No inguinal adenopathy present.       Left: No inguinal adenopathy present.  Neurological: He is oriented to person, place, and time.  Skin: Skin is warm and dry. No rash noted. He is not diaphoretic. No erythema. No pallor.  Psychiatric: He has a normal mood and affect. His behavior is normal. Judgment and thought content normal.      Lab Results  Component Value Date   WBC 9.5 02/09/2011   HGB 14.7 02/09/2011   HCT 42.9 02/09/2011   PLT 194 02/09/2011   GLUCOSE 87 02/08/2011   CHOL 147 10/05/2011   TRIG 38.0 10/05/2011   HDL 61.50 10/05/2011   LDLCALC 78 10/05/2011   ALT 25 10/05/2011   AST 31 10/05/2011   NA 139 02/08/2011   K 3.8 02/08/2011   CL 106 02/08/2011   CREATININE 0.95 02/08/2011   BUN 6 02/08/2011   CO2 28 02/08/2011   TSH 2.392 02/07/2011   INR 0.98 02/07/2011   HGBA1C  Value: 5.5 (NOTE)                                                                       According to the ADA Clinical Practice Recommendations for 2011, when HbA1c is used as a screening test:   >=6.5%   Diagnostic of Diabetes Mellitus           (if abnormal result  is confirmed)  5.7-6.4%   Increased risk of developing Diabetes Mellitus  References:Diagnosis and Classification of Diabetes Mellitus,Diabetes Care,2011,34(Suppl 1):S62-S69 and Standards of Medical Care in         Diabetes - 2011,Diabetes Care,2011,34  (Suppl 1):S11-S61. 02/06/2011      Assessment & Plan:

## 2012-04-26 NOTE — Assessment & Plan Note (Signed)
Labs today, GI referral

## 2012-04-27 ENCOUNTER — Encounter: Payer: Self-pay | Admitting: Internal Medicine

## 2012-04-27 ENCOUNTER — Encounter: Payer: Self-pay | Admitting: Gastroenterology

## 2012-05-16 ENCOUNTER — Ambulatory Visit: Payer: Self-pay | Admitting: Internal Medicine

## 2012-05-22 ENCOUNTER — Ambulatory Visit: Payer: 59 | Admitting: Gastroenterology

## 2012-06-19 ENCOUNTER — Ambulatory Visit: Payer: 59 | Admitting: Gastroenterology

## 2012-07-10 ENCOUNTER — Ambulatory Visit: Payer: 59 | Admitting: Gastroenterology

## 2012-08-10 ENCOUNTER — Ambulatory Visit (INDEPENDENT_AMBULATORY_CARE_PROVIDER_SITE_OTHER): Payer: 59 | Admitting: Gastroenterology

## 2012-08-10 ENCOUNTER — Encounter: Payer: Self-pay | Admitting: Gastroenterology

## 2012-08-10 VITALS — BP 118/64 | HR 88 | Ht 72.0 in | Wt 148.0 lb

## 2012-08-10 DIAGNOSIS — K219 Gastro-esophageal reflux disease without esophagitis: Secondary | ICD-10-CM

## 2012-08-10 DIAGNOSIS — K625 Hemorrhage of anus and rectum: Secondary | ICD-10-CM

## 2012-08-10 MED ORDER — PEG-KCL-NACL-NASULF-NA ASC-C 100 G PO SOLR: 1.0000 | Freq: Once | ORAL | Status: DC

## 2012-08-10 MED ORDER — ESOMEPRAZOLE MAGNESIUM 40 MG PO CPDR: 40.0000 mg | DELAYED_RELEASE_CAPSULE | Freq: Every day | ORAL | Status: DC

## 2012-08-10 NOTE — Patient Instructions (Addendum)
Diet for Gastroesophageal Reflux Disease, Adult  Reflux (acid reflux) is when acid from your stomach flows up into the esophagus. When acid comes in contact with the esophagus, the acid causes irritation and soreness (inflammation) in the esophagus. When reflux happens often or so severely that it causes damage to the esophagus, it is called gastroesophageal reflux disease (GERD). Nutrition therapy can help ease the discomfort of GERD.  FOODS OR DRINKS TO AVOID OR LIMIT   Smoking or chewing tobacco. Nicotine is one of the most potent stimulants to acid production in the gastrointestinal tract.   Caffeinated and decaffeinated coffee and black tea.   Regular or low-calorie carbonated beverages or energy drinks (caffeine-free carbonated beverages are allowed).    Strong spices, such as black pepper, white pepper, red pepper, cayenne, curry powder, and chili powder.   Peppermint or spearmint.   Chocolate.   High-fat foods, including meats and fried foods. Extra added fats including oils, butter, salad dressings, and nuts. Limit these to less than 8 tsp per day.   Fruits and vegetables if they are not tolerated, such as citrus fruits or tomatoes.   Alcohol.   Any food that seems to aggravate your condition.  If you have questions regarding your diet, call your caregiver or a registered dietitian.  OTHER THINGS THAT MAY HELP GERD INCLUDE:    Eating your meals slowly, in a relaxed setting.   Eating 5 to 6 small meals per day instead of 3 large meals.   Eliminating food for a period of time if it causes distress.   Not lying down until 3 hours after eating a meal.   Keeping the head of your bed raised 6 to 9 inches (15 to 23 cm) by using a foam wedge or blocks under the legs of the bed. Lying flat may make symptoms worse.   Being physically active. Weight loss may be helpful in reducing reflux in overweight or obese adults.   Wear loose fitting clothing  EXAMPLE MEAL PLAN  This meal plan is approximately  2,000 calories based on ChooseMyPlate.gov meal planning guidelines.  Breakfast    cup cooked oatmeal.   1 cup strawberries.   1 cup low-fat milk.   1 oz almonds.  Snack   1 cup cucumber slices.   6 oz yogurt (made from low-fat or fat-free milk).  Lunch   2 slice whole-wheat bread.   2 oz sliced turkey.   2 tsp mayonnaise.   1 cup blueberries.   1 cup snap peas.  Snack   6 whole-wheat crackers.   1 oz string cheese.  Dinner    cup brown rice.   1 cup mixed veggies.   1 tsp olive oil.   3 oz grilled fish.  Document Released: 10/17/2005 Document Revised: 01/09/2012 Document Reviewed: 09/02/2011  ExitCare Patient Information 2013 [employer].

## 2012-08-10 NOTE — Progress Notes (Signed)
History of Present Illness:  This is a very pleasant 41 year old F. American male who presents with several years of acid reflux with burning substernal chest pain and burping, not previously evaluated, and I previously treated. He specifically denies dysphagia, anorexia, weight loss, or any history of ulcer disease, gallbladder liver disease. He has had intermittent bright red blood per rectum and has "hemorrhoid problems ". Recent lab data testing was all normal with normal CBC a metabolic profile.  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 He does have mild lactose intolerance. The patient does use occasional NSAIDs for dental disease, and is on aspirin 81 mg a day. He denies exertional chest pain or other cardiovascular or pulmonary complaints at this time. He has a fullness in the subxiphoid area relieved by belching, but denies any other upper GI or specific hepatobiliary complaints. There is no history of chronic alcoholism allegedly. Family history is also noncontributory.  I have reviewed this patient's present history, medical and surgical past history, allergies and medications.     ROS: The remainder of the 10 point ROS is negative     Physical Exam: Blood pressure 118/64, pulse 88 and regular, and weight 148 pounds with a BMI of 20.07. General well developed well nourished patient in no acute distress, appearing his stated age Eyes PERRLA, no icterus, fundoscopic exam per opthamologist Skin no lesions noted Neck supple, no adenopathy, no thyroid enlargement, no tenderness Chest clear to percussion and auscultation Heart no significant murmurs, gallops or rubs noted Abdomen no hepatosplenomegaly masses or tenderness, BS normal. . Extremities no acute joint lesions, edema, phlebitis or evidence of cellulitis. Neurologic patient oriented x 3, cranial nerves intact, no focal neurologic deficits  noted. Psychological mental status normal and normal affect.  Assessment and plan: Chronic GERD, rule out Barrett's mucosa per his history of chronic cigarette use. I have scheduled endoscopic exam, reviewed a standard antire- flux regime, he saw our patient education movie on GERD and is management, and I have started Nexium 40 mg every morning. Because of his rectal bleeding, with schedule colonoscopy exam at the time of his endoscopy. As mentioned above, the patient does use NSAIDs which may contribute to his problems. At the time of endoscopic exam we will check for H. pylori infection. He does not seem to be symptomatic from his mild lactose intolerance. I again have spoken to the patient about the importance of smoking cessation with his coronary artery disease. He repeatedly denies any use of amphetamines, cocaine, or other drug stimulants.   Encounter Diagnoses  Name Primary?  . Rectal bleed Yes  . GERD (gastroesophageal reflux disease)

## 2012-08-20 ENCOUNTER — Encounter: Payer: Self-pay | Admitting: Gastroenterology

## 2012-08-20 ENCOUNTER — Ambulatory Visit (AMBULATORY_SURGERY_CENTER): Payer: 59 | Admitting: Gastroenterology

## 2012-08-20 VITALS — BP 130/56 | HR 55 | Temp 97.8°F | Resp 18 | Ht 72.0 in | Wt 148.0 lb

## 2012-08-20 DIAGNOSIS — K219 Gastro-esophageal reflux disease without esophagitis: Secondary | ICD-10-CM

## 2012-08-20 DIAGNOSIS — K625 Hemorrhage of anus and rectum: Secondary | ICD-10-CM

## 2012-08-20 DIAGNOSIS — K644 Residual hemorrhoidal skin tags: Secondary | ICD-10-CM

## 2012-08-20 DIAGNOSIS — K921 Melena: Secondary | ICD-10-CM

## 2012-08-20 DIAGNOSIS — D126 Benign neoplasm of colon, unspecified: Secondary | ICD-10-CM

## 2012-08-20 DIAGNOSIS — K648 Other hemorrhoids: Secondary | ICD-10-CM

## 2012-08-20 MED ORDER — SODIUM CHLORIDE 0.9 % IV SOLN: 500.0000 mL | INTRAVENOUS | Status: DC

## 2012-08-20 NOTE — Progress Notes (Signed)
Patient did not experience any of the following events: a burn prior to discharge; a fall within the facility; wrong site/side/patient/procedure/implant event; or a hospital transfer or hospital admission upon discharge from the facility. (G8907) Patient did not have preoperative order for IV antibiotic SSI prophylaxis. (G8918)  

## 2012-08-20 NOTE — Patient Instructions (Addendum)
YOU HAD AN ENDOSCOPIC PROCEDURE TODAY AT THE [facility]: Refer to the procedure report that was given to you for any specific questions about what was found during the examination.  If the procedure report does not answer your questions, please call your gastroenterologist to clarify.  If you requested that your care partner not be given the details of your procedure findings, then the procedure report has been included in a sealed envelope for you to review at your convenience later.  YOU SHOULD EXPECT: Some feelings of bloating in the abdomen. Passage of more gas than usual.  Walking can help get rid of the air that was put into your GI tract during the procedure and reduce the bloating. If you had a lower endoscopy (such as a colonoscopy or flexible sigmoidoscopy) you may notice spotting of blood in your stool or on the toilet paper. If you underwent a bowel prep for your procedure, then you may not have a normal bowel movement for a few days.  DIET: Your first meal following the procedure should be a light meal and then it is ok to progress to your normal diet.  A half-sandwich or bowl of soup is an example of a good first meal.  Heavy or fried foods are harder to digest and may make you feel nauseous or bloated.  Likewise meals heavy in dairy and vegetables can cause extra gas to form and this can also increase the bloating.  Drink plenty of fluids but you should avoid alcoholic beverages for 24 hours.  ACTIVITY: Your care partner should take you home directly after the procedure.  You should plan to take it easy, moving slowly for the rest of the day.  You can resume normal activity the day after the procedure however you should NOT DRIVE or use heavy machinery for 24 hours (because of the sedation medicines used during the test).    SYMPTOMS TO REPORT IMMEDIATELY: A gastroenterologist can be reached at any hour.  During normal business hours, 8:30 AM to 5:00 PM Monday through Friday,  call [phone].  After hours and on weekends, please call the GI answering service at [phone] who will take a message and have the physician on call contact you.   Following lower endoscopy (colonoscopy or flexible sigmoidoscopy):  Excessive amounts of blood in the stool  Significant tenderness or worsening of abdominal pains  Swelling of the abdomen that is new, acute  Fever of 100F or higher  Following upper endoscopy (EGD)  Vomiting of blood or coffee ground material  New chest pain or pain under the shoulder blades  Painful or persistently difficult swallowing  New shortness of breath  Fever of 100F or higher  Black, tarry-looking stools  FOLLOW UP: If any biopsies were taken you will be contacted by phone or by letter within the next 1-3 weeks.  Call your gastroenterologist if you have not heard about the biopsies in 3 weeks.  Our staff will call the home number listed on your records the next business day following your procedure to check on you and address any questions or concerns that you may have at that time regarding the information given to you following your procedure. This is a courtesy call and so if there is no answer at the home number and we have not heard from you through the emergency physician on call, we will assume that you have returned to your regular daily activities without incident.  SIGNATURES/CONFIDENTIALITY: You and/or your care   partner have signed paperwork which will be entered into your electronic medical record.  These signatures attest to the fact that that the information above on your After Visit Summary has been reviewed and is understood.  Full responsibility of the confidentiality of this discharge information lies with you and/or your care-partner.  

## 2012-08-20 NOTE — Op Note (Signed)
[facility] [address] [place], [address]   COLONOSCOPY PROCEDURE REPORT  PATIENT: Sean Morris, Sean Morris  MR#: [id number] BIRTHDATE: 24-Nov-1970 , 40  yrs. old GENDER: Male ENDOSCOPIST: Mardella Layman, MD, [facility] REFERRED BY:  Etta Grandchild, M.D. PROCEDURE DATE:  08/20/2012 PROCEDURE:   Colonoscopy with snare polypectomy ASA CLASS:   Class II INDICATIONS:average risk patient for colon cancer and hematochezia.  MEDICATIONS: propofol (Diprivan) 400mg  IV  DESCRIPTION OF PROCEDURE:   After the risks and benefits and of the procedure were explained, informed consent was obtained.  A digital rectal exam revealed external thrombosed hemorrhoids.    The LB [id number]  endoscope was introduced through the anus and advanced to the cecum, which was identified by both the appendix and ileocecal valve .  The quality of the prep was good, using MoviPrep .  The instrument was then slowly withdrawn as the colon was fully examined.     COLON FINDINGS: A polypoid shaped  vascular-pedunculated polyp measuring 2.0 cm in size was found in the sigmoid colon.  A polypectomy was performed using snare cautery.  The resection was complete and the polyp tissue was completely retrieved.   Another 7-71mm rectal polyp hot snare excised,,#2.  Retroflexed views revealed internal/external hemorrhoids.     The scope was then withdrawn from the patient and the procedure completed.  COMPLICATIONS: There were no complications. ENDOSCOPIC IMPRESSION:  Pedunculated polyp measuring 2.0 cm in size was found in the sigmoid colon and rectum ; polypectomy was performed using snare cautery ,R/O atypia.Marland KitchenMarland KitchenLARGE MIXED HEMORRHOIDS NOTED.  RECOMMENDATIONS: 1.  Repeat colonoscopy in 5 years if polyp adenomatous; otherwise 10 years 2.  continue current medications 3.  Upper endoscopy will be scheduled   REPEAT EXAM:  cc:  _______________________________ eSignedMardella Layman, MD,  [facility] 08/20/2012 11:40 AM

## 2012-08-20 NOTE — Op Note (Signed)
[facility] [address] [place], [address]   ENDOSCOPY PROCEDURE REPORT  PATIENT: Sean, Morris  MR#: [id number] BIRTHDATE: 1971-04-05 , 40  yrs. old GENDER: Male ENDOSCOPIST:Willine Schwalbe Hale Bogus, MD, Clementeen Graham REFERRED BY: Etta Grandchild, M.D. PROCEDURE DATE:  08/20/2012 PROCEDURE:   EGD, diagnostic ASA CLASS:    Class II INDICATIONS: heartburn and history of esophageal reflux. MEDICATION: There was residual sedation effect present from prior procedure and propofol (Diprivan) 150mg  IV TOPICAL ANESTHETIC:  DESCRIPTION OF PROCEDURE:   After the risks and benefits of the procedure were explained, informed consent was obtained.  The [facility] GIF-H180 [id number]  endoscope was introduced through the mouth  and advanced to the second portion of the duodenum .  The instrument was slowly withdrawn as the mucosa was fully examined.    The upper, middle and distal third of the esophagus were carefully inspected and no abnormalities were noted.  The z-line was well seen at the GEJ.  The endoscope was pushed into the fundus which was normal including a retroflexed view.  The antrum, gastric body, first and second part of the duodenum were unremarkable. Retroflexed views revealed no abnormalities.    The scope was then withdrawn from the patient and the procedure completed.  COMPLICATIONS: There were no complications.   ENDOSCOPIC IMPRESSION: Normal EGD ...treated GERD  RECOMMENDATIONS: continue PPI    _______________________________ eSigned:  Mardella Layman, MD, [facility] 08/20/2012 11:46 AM   antireflux

## 2012-08-21 ENCOUNTER — Telehealth: Payer: Self-pay | Admitting: *Deleted

## 2012-08-21 NOTE — Telephone Encounter (Signed)
  Follow up Call-  Call back number 08/20/2012  Post procedure Call Back phone  # [phone] cell  Permission to leave phone message Yes     Patient questions:  Do you have a fever, pain , or abdominal swelling? no Pain Score  0 *  Have you tolerated food without any problems? yes  Have you been able to return to your normal activities? yes  Do you have any questions about your discharge instructions: Diet   no Medications  no Follow up visit  no  Do you have questions or concerns about your Care? no  Actions: * If pain score is 4 or above: No action needed, pain <4.

## 2012-08-22 LAB — HM COLONOSCOPY

## 2012-08-23 ENCOUNTER — Encounter: Payer: Self-pay | Admitting: Gastroenterology

## 2012-09-04 ENCOUNTER — Telehealth: Payer: Self-pay | Admitting: Gastroenterology

## 2012-09-04 NOTE — Telephone Encounter (Signed)
When necessary Sitz baths and Analpram cream prn qhs

## 2012-09-04 NOTE — Telephone Encounter (Signed)
Pt never received his post procedure letter. Went over the letter and results with him and he stated understanding, and I mailed him a new letter. Pt reports he still has bleeding from his rectum at times since the COLON; on the toilet paper and in the bowl. Dr Jarold Motto, pt's COLON noted large hemorrhoids; any orders or advice? Thanks.

## 2012-09-05 MED ORDER — PRAMOXINE-HC 1-1 % EX CREA: TOPICAL_CREAM | CUTANEOUS | Status: DC

## 2012-09-05 NOTE — Telephone Encounter (Signed)
Informed pt to do sitz baths or tub soaks 2-3 times daily and I will order Analpram cream for him; pt stated understanding.

## 2012-11-01 ENCOUNTER — Ambulatory Visit (INDEPENDENT_AMBULATORY_CARE_PROVIDER_SITE_OTHER): Payer: 59 | Admitting: Cardiovascular Disease

## 2012-11-01 ENCOUNTER — Encounter: Payer: Self-pay | Admitting: Cardiovascular Disease

## 2012-11-01 ENCOUNTER — Encounter: Payer: Self-pay | Admitting: *Deleted

## 2012-11-01 VITALS — BP 130/87 | HR 54 | Ht 72.0 in | Wt 149.0 lb

## 2012-11-01 DIAGNOSIS — I2510 Atherosclerotic heart disease of native coronary artery without angina pectoris: Secondary | ICD-10-CM

## 2012-11-01 NOTE — Patient Instructions (Addendum)
Your physician wants you to follow-up in:  12 months.  You will receive a reminder letter in the mail two months in advance. If you don't receive a letter, please call our office to schedule the follow-up appointment.   

## 2012-11-01 NOTE — Progress Notes (Signed)
History of Present Illness: 42 yo AAM with history of tobacco abuse and CAD here today for cardiac follow up. He was admitted to [facility] 02/07/11 with c/o chest pain and found to have severe mid LAD stenosis. A 3.5 x 15 mm Vision bare metal stent was placed after IVUS confirmed the severity of the lesion. The other vessels were free of disease. LV function was normal.   He is here today for follow up. He is feeling well. No chest pain, SOB, palpitations, near syncope or syncope. He has started back smoking. He stopped again yesterday. He was off of his Crestor for about two months.   Primary Care Physician: Sanda Linger  Last Lipid Profile:Lipid Panel     Component Value Date/Time   CHOL 166 04/26/2012 1536   TRIG 79.0 04/26/2012 1536   HDL 65.00 04/26/2012 1536   CHOLHDL 3 04/26/2012 1536   VLDL 15.8 04/26/2012 1536   LDLCALC 85 04/26/2012 1536     Past Medical History  Diagnosis Date  . CAD (coronary artery disease)     s/p 3.5 x 15 mm bare metal stent mid LAD April 2012  . Hemorrhoids   . GERD (gastroesophageal reflux disease)   . Hyperlipidemia   . Plantar fasciitis     Past Surgical History  Procedure Date  . Coronary stent placement   . Plantar fasciitis surgery   . Extraction of wisdom teeth     Current Outpatient Prescriptions  Medication Sig Dispense Refill  . aspirin 81 MG tablet Take 81 mg by mouth daily.        Marland Kitchen esomeprazole (NEXIUM) 40 MG capsule Take 1 capsule (40 mg total) by mouth daily before breakfast.  30 capsule  6  . Flaxseed, Linseed, (FLAX SEED OIL PO) Take by mouth. 1 tab daily      . Multiple Vitamins-Minerals (MULTIVITAMIN PO) Take by mouth daily.        . rosuvastatin (CRESTOR) 5 MG tablet Take 1 tablet (5 mg total) by mouth daily.  90 tablet  3    Allergies  Allergen Reactions  . Pravastatin Itching and Rash    History   Social History  . Marital Status: Single    Spouse Name: N/A    Number of Children: N/A  . Years of  Education: N/A   Occupational History  . Transportation      [employer]    Social History Main Topics  . Smoking status: Former Smoker -- 0.5 packs/day for 20 years  . Smokeless tobacco: Never Used     Comment: pt stopped two days ago  . Alcohol Use: 0.0 oz/week     Comment: 1-2 drinks daily   . Drug Use: Yes    Special: Marijuana  . Sexually Active: Yes   Other Topics Concern  . Not on file   Social History Narrative   Daily caffeine     Family History  Problem Relation Age of Onset  . Diabetes Mother   . Hypertension Mother   . Arthritis Mother   . Colon polyps Mother   . Heart attack Paternal Grandfather   . Cancer Neg Hx   . Alcohol abuse Neg Hx   . Stroke Neg Hx   . Heart disease Neg Hx   . Hyperlipidemia Neg Hx   . Kidney disease Neg Hx   . Colon cancer Neg Hx   . Esophageal cancer Neg Hx   . Rectal cancer Neg Hx   .  Stomach cancer Neg Hx     Review of Systems:  As stated in the HPI and otherwise negative.   BP 130/87  Pulse 54  Ht 6' (1.829 m)  Wt 149 lb (67.586 kg)  BMI 20.21 kg/m2  Physical Examination: General: Well developed, well nourished, NAD HEENT: OP clear, mucus membranes moist SKIN: warm, dry. No rashes. Neuro: No focal deficits Musculoskeletal: Muscle strength 5/5 all ext Psychiatric: Mood and affect normal Neck: No JVD, no carotid bruits, no thyromegaly, no lymphadenopathy. Lungs:Clear bilaterally, no wheezes, rhonci, crackles Cardiovascular: Regular rate and rhythm. No murmurs, gallops or rubs. Abdomen:Soft. Bowel sounds present. Non-tender.  Extremities: No lower extremity edema. Pulses are 2 + in the bilateral DP/PT.  EKG: Sinus brady, rate 54 bpm.   Assessment and Plan:   1. CAD: Stable. He is on an ASA and a statin. Lipids well controlled. BP well controlled. No beta blocker secondary to bradycardia.

## 2013-03-19 ENCOUNTER — Encounter: Payer: Self-pay | Admitting: *Deleted

## 2013-03-19 ENCOUNTER — Ambulatory Visit (INDEPENDENT_AMBULATORY_CARE_PROVIDER_SITE_OTHER): Payer: 59 | Admitting: Physician Assistant

## 2013-03-19 ENCOUNTER — Encounter: Payer: Self-pay | Admitting: Physician Assistant

## 2013-03-19 VITALS — BP 140/84 | HR 55 | Ht 72.0 in | Wt 150.2 lb

## 2013-03-19 DIAGNOSIS — R079 Chest pain, unspecified: Secondary | ICD-10-CM

## 2013-03-19 DIAGNOSIS — R002 Palpitations: Secondary | ICD-10-CM

## 2013-03-19 DIAGNOSIS — E7800 Pure hypercholesterolemia, unspecified: Secondary | ICD-10-CM

## 2013-03-19 DIAGNOSIS — I2510 Atherosclerotic heart disease of native coronary artery without angina pectoris: Secondary | ICD-10-CM

## 2013-03-19 LAB — BASIC METABOLIC PANEL
CO2: 33 mEq/L — ABNORMAL HIGH (ref 19–32)
Chloride: 100 mEq/L (ref 96–112)
Glucose, Bld: 61 mg/dL — ABNORMAL LOW (ref 70–99)
Potassium: 4.8 mEq/L (ref 3.5–5.1)
Sodium: 140 mEq/L (ref 135–145)

## 2013-03-19 LAB — TSH: TSH: 0.53 u[IU]/mL (ref 0.35–5.50)

## 2013-03-19 NOTE — Patient Instructions (Addendum)
Your physician has requested that you have an echocardiogram. Echocardiography is a painless test that uses sound waves to create images of your heart. It provides your doctor with information about the size and shape of your heart and how well your heart's chambers and valves are working. This procedure takes approximately one hour. There are no restrictions for this procedure.  Your physician has requested that you have an exercise tolerance test. For further information please visit [email or web address] Please also follow instruction sheet, as given.  Your physician recommends that you return for lab work in: today  Your physician recommends that you schedule a follow-up appointment in: as previously arranged  Please check your BP 2 to 3 times weekly and call office at [phone] if your BP is greater than 140/90

## 2013-03-19 NOTE — Progress Notes (Signed)
[address], [address] [place], [place]  [address] Phone: [phone] Fax:  [phone]  Date:  03/19/2013   ID:  Sean Morris, DOB 11/09/1970, MRN [id number]  PCP:  Sanda Linger, MD  Primary Cardiologist:  Dr. Verne Carrow     History of Present Illness: Sean Morris is a 42 y.o. male who returns for the evaluation of palpitations.  He has a history of CAD, HL, tobacco abuse. He was admitted 02/07/11 with unstable angina. LHC 4/12: Mid LAD 90%, EF 50-55%. PCI: Vision BMS to the mid LAD. Last seen by Dr. Verne Carrow 1/14. He was doing well at that time with plans for one year follow up.  He quit smoking 2 weeks ago. Prior to quitting, he would note occasional palpitations. This would typically occur after smoking a cigarette. He thought that he may be having a panic attack. It is difficult to describe. He thinks it may have felt pressure-like at times.  He would not brief dyspnea. Symptoms would last just a few seconds. He denies syncope or near-syncope. He has not had a recurrence since quitting smoking. He denies orthopnea, PND, edema. He denies exertional chest pain or shortness of breath.  Labs (6/13):  K 4.4, Cr 1.1, ALT 19, LDL 85, Hgb 15.8   Wt Readings from Last 3 Encounters:  03/19/13 150 lb 3.2 oz (68.13 kg)  11/01/12 149 lb (67.586 kg)  08/20/12 148 lb (67.132 kg)     Past Medical History  Diagnosis Date  . CAD (coronary artery disease)     s/p 3.5 x 15 mm bare metal stent mid LAD April 2012  . Hemorrhoids   . GERD (gastroesophageal reflux disease)   . Hyperlipidemia   . Plantar fasciitis     Current Outpatient Prescriptions  Medication Sig Dispense Refill  . aspirin 81 MG tablet Take 81 mg by mouth daily.        Marland Kitchen esomeprazole (NEXIUM) 40 MG capsule Take 1 capsule (40 mg total) by mouth daily before breakfast.  30 capsule  6  . Flaxseed, Linseed, (FLAX SEED OIL PO) Take by mouth. 1 tab daily      . Multiple Vitamins-Minerals  (MULTIVITAMIN PO) Take by mouth daily.        . rosuvastatin (CRESTOR) 5 MG tablet Take 1 tablet (5 mg total) by mouth daily.  90 tablet  3   No current facility-administered medications for this visit.    Allergies:    Allergies  Allergen Reactions  . Pravastatin Itching and Rash    Social History:  The patient  reports that he has quit smoking. He has never used smokeless tobacco. He reports that  drinks alcohol. He reports that he uses illicit drugs (Marijuana).   ROS:  Please see the history of present illness.   He had recent URI symptoms. He did take TheraFlu at one point as well as Coricidin.   He denies vomiting, diarrhea, melena, hematochezia.  All other systems reviewed and negative.   PHYSICAL EXAM: VS:  BP 140/84  Pulse 55  Ht 6' (1.829 m)  Wt 150 lb 3.2 oz (68.13 kg)  BMI 20.37 kg/m2 Well nourished, well developed, in no acute distress HEENT: normal Neck: no JVD Vascular: No carotid bruits Endocrine: No thyromegaly Cardiac:  normal S1, S2; RRR; no murmur Lungs:  clear to auscultation bilaterally, no wheezing, rhonchi or rales Abd: soft, nontender, no hepatomegaly Ext: no edema Skin: warm and dry Neuro:  CNs 2-12 intact, no  focal abnormalities noted  EKG:  Sinus bradycardia, HR 55, borderline LVH, normal axis, nonspecific ST-T wave changes     ASSESSMENT AND PLAN:  1. Palpitations: I suspect he is describing PACs versus PVCs.  This was in the context of a recent illness. He has not had a recurrence since quitting smoking. We'll check a basic metabolic panel, magnesium and TSH today. I will also obtain an echocardiogram. If he has recurrence of symptoms, arrange Holter monitor. We discussed avoiding caffeine and stimulants which may cause worsening palpitations. 2. CAD:  He did have some chest discomfort with his palpitations. I suspect that this is not related to ischemia. However, he does want to start an exercise program at the gym that he recently joined.  Therefore, I will set him up for a routine exercise treadmill test (POET). This will allow me to give him a prescription for exercise. Continue aspirin and statin. 3. Elevated Blood Pressure:  I have asked him to get a blood pressure cuff and monitor his blood pressures at home. His echocardiogram will allow me to assess for LVH. If his blood pressures remain elevated, consider ACE inhibitor. 4. Hyperlipidemia:  Continue statin. 5. Disposition:  F/u with Dr. Verne Carrow as planned.   SignedTereso Newcomer, PA-C  9:06 AM 03/19/2013

## 2013-04-16 ENCOUNTER — Ambulatory Visit (INDEPENDENT_AMBULATORY_CARE_PROVIDER_SITE_OTHER): Payer: 59 | Admitting: Physician Assistant

## 2013-04-16 ENCOUNTER — Telehealth: Payer: Self-pay | Admitting: *Deleted

## 2013-04-16 ENCOUNTER — Encounter: Payer: Self-pay | Admitting: Physician Assistant

## 2013-04-16 ENCOUNTER — Ambulatory Visit (HOSPITAL_COMMUNITY): Payer: 59 | Attending: Cardiovascular Disease

## 2013-04-16 DIAGNOSIS — R002 Palpitations: Secondary | ICD-10-CM | POA: Insufficient documentation

## 2013-04-16 DIAGNOSIS — Z87891 Personal history of nicotine dependence: Secondary | ICD-10-CM | POA: Insufficient documentation

## 2013-04-16 DIAGNOSIS — I2510 Atherosclerotic heart disease of native coronary artery without angina pectoris: Secondary | ICD-10-CM | POA: Insufficient documentation

## 2013-04-16 DIAGNOSIS — R079 Chest pain, unspecified: Secondary | ICD-10-CM | POA: Insufficient documentation

## 2013-04-16 DIAGNOSIS — E785 Hyperlipidemia, unspecified: Secondary | ICD-10-CM | POA: Insufficient documentation

## 2013-04-16 NOTE — Progress Notes (Signed)
Exercise Treadmill Test  Pre-Exercise Testing Evaluation Rhythm: sinus bradycardia  Rate: 50     Test  Exercise Tolerance Test Ordering MD: Melene Muller, MD  Interpreting MD: Tereso Newcomer, PA-C  Unique Test No: 1  Treadmill:  1  Indication for ETT: chest pain - rule out ischemia  Contraindication to ETT: No   Stress Modality: exercise - treadmill  Cardiac Imaging Performed: non   Protocol: standard Bruce - maximal  Max BP:  220/96  Max MPHR (bpm):  178 85% MPR (bpm):  151  MPHR obtained (bpm):  171 % MPHR obtained:  96  Reached 85% MPHR (min:sec):  9:30 Total Exercise Time (min-sec):  11:01  Workload in METS:  13.4 Borg Scale: 16  Reason ETT Terminated:  patient's desire to stop    ST Segment Analysis At Rest: normal ST segments - no evidence of significant ST depression With Exercise: non-specific ST changes  Other Information Arrhythmia:  No Angina during ETT:  absent (0) Quality of ETT:  diagnostic  ETT Interpretation:  normal - no evidence of ischemia by ST analysis  Comments: Good exercise tolerance. No chest pain. Hypertensive BP response to exercise. No significant ST-T changes to suggest ischemia.   Recommendations: F/u with Dr. Verne Carrow as directed. Signed,  Tereso Newcomer, PA-C   04/16/2013 10:00 AM

## 2013-04-16 NOTE — Telephone Encounter (Signed)
lmom echo normal 

## 2013-04-16 NOTE — Telephone Encounter (Signed)
Message copied by Tarri Fuller on Tue Apr 16, 2013  5:54 PM ------      Message from: [place], [place] T      Created: Tue Apr 16, 2013  5:43 PM       Normal LV function      Continue with current treatment plan.      Tereso Newcomer, PA-C        04/16/2013 5:43 PM ------

## 2013-04-16 NOTE — Progress Notes (Signed)
Echocardiogram performed.  

## 2013-08-15 ENCOUNTER — Telehealth: Payer: Self-pay

## 2013-08-15 NOTE — Telephone Encounter (Signed)
Received call from patient he stated he wanted to know if he could damage his coronary stent.Stated he was exercising yesterday 08/14/13 with weights and today his rt groin is sore,no lump noticed.Stated no chest pain.Patient advised sounds like he pulled a muscle will sent message to Dr.McAlhany for review.

## 2013-08-16 NOTE — Telephone Encounter (Signed)
Returned call to patient no answer.Left message on personal voice mail Dr.McAlhany did not think exercise had any cardiac relevance.Advised call back if needed.

## 2013-08-16 NOTE — Telephone Encounter (Signed)
I do not think this has any cardiac relevance. Thanks, chris

## 2015-06-08 ENCOUNTER — Telehealth: Payer: Self-pay | Admitting: Cardiovascular Disease

## 2015-06-08 NOTE — Telephone Encounter (Signed)
New message     Pt states he is having chest tightness  Pt c/o of Chest Pain: STAT if CP now or developed within 24 hours  1. Are you having CP right now? Pt having tightness in chest; pt having discomfort  2. Are you experiencing any other symptoms (ex. SOB, nausea, vomiting, sweating)? No  3. How long have you been experiencing CP? 1 month off and on  4. Is your CP continuous or coming and going? Continuous today but has been coming and going before  5. Have you taken Nitroglycerin? No  Please call to discuss ?

## 2015-06-08 NOTE — Telephone Encounter (Signed)
I spoke with the patient. He was last seen in our office 02/2013 by Richardson Dopp, PA (for Dr. Angelena Form) The patient does have a history of CAD with stenting 01/2011. He is calling today to report chest tightness that has been intermittent over the last 1-2 months.  He states this feels worse with deep breathing, but no difference noted with various activities. His discomfort is mid sternal to left sided, but is non radiating.  He denies SOB/ nausea/ vomiting/ diaphoresis/ palpitations. It feels a little worse if he presses on his chest.  I advised the patient that I am unsure if his symptoms are cardiac in nature, but with his cardiac history and the fact that he resumed smoking in November/ December I would schedule a follow up for him. He feels that his discomfort is more persistent today. I have inquired if he still has NTG RX, but he states this is expired.  I offered to send this in for him today to the pharmacy, but he stated he would wait for his evaluation with Richardson Dopp, PA that has been scheduled for 06/10/15. He is currently also off of his statin.  I have advised the patient to report to the ER in the interim if symptoms persist/ worsen.  He voices understanding.

## 2015-06-09 NOTE — Progress Notes (Signed)
Cardiology Office Note   Date:  06/10/2015   ID:  ANDYN SALES, DOB 15-Sep-1971, MRN [id number]  PCP:  Scarlette Calico, MD  Cardiologist:  Dr. Lauree Chandler     Chief Complaint  Patient presents with  . Chest Pain  . Coronary Artery Disease     History of Present Illness: Sean Morris is a 44 y.o. male with a hx of CAD, HL, tobacco abuse. He was admitted 02/07/11 with unstable angina. LHC 4/12: Mid LAD 90%, EF 50-55%. PCI: Vision BMS to the mid LAD.  Last seen here 02/2013.    He is here today with his wife. He got married since his last visit.  He works with [employer].  He transports elderly clients.  He presents today with complaints of chest pain starting 3 days ago.  He notes pleuritic symptoms.  He also notes symptoms with palpating his chest.  He denies dyspnea, syncope.  He denies orthopnea, PND, edema.  His symptoms have gotten better since they started. He has very little chest pain today.  It is only noted with palpation.  Denies cough, wheezing.  Studies/Reports Reviewed Today:  Echo 03/2013 Mild LVH, EF 55-65%, no RWMA  ETT 03/2013 ETT Interpretation: normal - no evidence of ischemia by ST analysis   Past Medical History  Diagnosis Date  . CAD (coronary artery disease)     s/p 3.5 x 15 mm bare metal stent mid LAD April 2012  . Hemorrhoids   . GERD (gastroesophageal reflux disease)   . Hyperlipidemia   . Plantar fasciitis   . History of echocardiogram     Echo 6/14: Mild LVH, EF 55-65%, normal wall motion    Past Surgical History  Procedure Laterality Date  . Coronary stent placement    . Plantar fasciitis surgery    . Extraction of wisdom teeth       Current Outpatient Prescriptions  Medication Sig Dispense Refill  . aspirin 81 MG tablet Take 81 mg by mouth daily.       No current facility-administered medications for this visit.    Allergies:   Crestor and Pravastatin    Social History:  The patient  reports that he has quit  smoking. He has never used smokeless tobacco. He reports that he drinks alcohol. He reports that he uses illicit drugs (Marijuana).   Family History:  The patient's family history includes Arthritis in his mother; Colon polyps in his mother; Diabetes in his mother; Heart attack in his paternal grandfather; Hypertension in his mother. There is no history of Cancer, Alcohol abuse, Stroke, Heart disease, Hyperlipidemia, Kidney disease, Colon cancer, Esophageal cancer, Rectal cancer, or Stomach cancer.    ROS:   Please see the history of present illness.   Review of Systems  All other systems reviewed and are negative.     PHYSICAL EXAM: VS:  BP 134/60 mmHg  Pulse 54  Ht 6' (1.829 m)  Wt 151 lb 9.6 oz (68.765 kg)  BMI 20.56 kg/m2  SpO2 99%    Wt Readings from Last 3 Encounters:  06/10/15 151 lb 9.6 oz (68.765 kg)  03/19/13 150 lb 3.2 oz (68.13 kg)  11/01/12 149 lb (67.586 kg)     GEN: Well nourished, well developed, in no acute distress HEENT: normal Neck: no JVD, no carotid bruits, no masses Cardiac:  Normal S1/S2, RRR; no murmur ,  no rubs or gallops, no edema   Respiratory:  clear to auscultation bilaterally, no wheezing,  rhonchi or rales. GI: soft, nontender, nondistended, + BS MS: no deformity or atrophy Skin: warm and dry  Neuro:  CNs II-XII intact, Strength and sensation are intact Psych: Normal affect   EKG:  EKG is ordered today.  It demonstrates:   NSR, HR 54, LVH, no change from prior tracing.    Recent Labs: No results found for requested labs within last 365 days.    Lipid Panel    Component Value Date/Time   CHOL 166 04/26/2012 1536   TRIG 79.0 04/26/2012 1536   HDL 65.00 04/26/2012 1536   CHOLHDL 3 04/26/2012 1536   VLDL 15.8 04/26/2012 1536   LDLCALC 85 04/26/2012 1536      ASSESSMENT AND PLAN:   1.  Chest Pain:  Symptoms are atypical.  His PCI was in 2012.  Last assessment for ischemia was 2014.  He is not taking ASA and is smoking again.  He is  also off of statin Rx b/c of a rash.  I suspect his symptoms are MSK.  I will have him take Prilosec OTC 20 mg QD x 2 weeks and Ibuprofen 400 mg .Three times a day for 1 week.  I will check a CXR and arrange an ETT- Myoview.  No further testing if nuclear study and CXR unremarkable.    2.  CAD:  Proceed with Myoview as noted.  Resume ASA.  He is not on beta-blocker due to bradycardia.    3.  Dyslipidemia:  He had a rash with Pravastatin and Crestor.  He would benefit from statin Rx with his hx of CAD.  We discussed trying a different statin vs Zetia (IMPROVE-IT trial) vs referral to the [facility] for help with further recommendations. He prefers to see the [facility].  Will check Lipids and LFTs and refer to the Lipid Clinic.     Medication Changes: Current medicines are reviewed at length with the patient today.  Concerns regarding medicines are as outlined above.  The following changes have been made:   Discontinued Medications   ESOMEPRAZOLE (NEXIUM) 40 MG CAPSULE    Take 1 capsule (40 mg total) by mouth daily before breakfast.   FLAXSEED, LINSEED, (FLAX SEED OIL PO)    Take by mouth. 1 tab daily   MULTIPLE VITAMINS-MINERALS (MULTIVITAMIN PO)    Take by mouth daily.     ROSUVASTATIN (CRESTOR) 5 MG TABLET    Take 1 tablet (5 mg total) by mouth daily.   Modified Medications   No medications on file   New Prescriptions   No medications on file    Labs/ tests ordered today include:   Orders Placed This Encounter  Procedures  . DG Chest 2 View  . Lipid Profile  . Hepatic function panel  . Ambulatory referral to Lipid Clinic  . Myocardial Perfusion Imaging  . EKG 12-Lead     Disposition:   FU with Dr. Lauree Chandler or me 6 mos.    Signed, Versie Starks, MHS 06/10/2015 12:46 PM    [facility] [place], [place], [place]  [address] Phone: [phone]; Fax: [phone]

## 2015-06-10 ENCOUNTER — Encounter: Payer: Self-pay | Admitting: Physician Assistant

## 2015-06-10 ENCOUNTER — Ambulatory Visit (INDEPENDENT_AMBULATORY_CARE_PROVIDER_SITE_OTHER): Payer: 59 | Admitting: Physician Assistant

## 2015-06-10 ENCOUNTER — Ambulatory Visit
Admission: RE | Admit: 2015-06-10 | Discharge: 2015-06-10 | Disposition: A | Discharge status: Home / Self Care | Payer: 59 | Source: Ambulatory Visit | Attending: Physician Assistant | Admitting: Physician Assistant

## 2015-06-10 ENCOUNTER — Encounter: Payer: Self-pay | Admitting: *Deleted

## 2015-06-10 ENCOUNTER — Other Ambulatory Visit: Payer: Self-pay | Admitting: *Deleted

## 2015-06-10 VITALS — BP 134/60 | HR 54 | Ht 72.0 in | Wt 151.6 lb

## 2015-06-10 DIAGNOSIS — R0789 Other chest pain: Secondary | ICD-10-CM

## 2015-06-10 DIAGNOSIS — R079 Chest pain, unspecified: Secondary | ICD-10-CM

## 2015-06-10 DIAGNOSIS — I2510 Atherosclerotic heart disease of native coronary artery without angina pectoris: Secondary | ICD-10-CM | POA: Diagnosis not present

## 2015-06-10 DIAGNOSIS — E785 Hyperlipidemia, unspecified: Secondary | ICD-10-CM

## 2015-06-10 NOTE — Patient Instructions (Addendum)
Medication Instructions:  1. START ASPIRIN 81 MG DAILY  2. TAKE IBUPROFEN 400 MG THREE TIMES DAILY FOR 1 WEEK THEN STOP; MAKE SURE TO TAKE WITH FOOD  3. TAKE OTC PRILOSEC 20 MG DAILY FOR 2 WEEKS   Labwork: FASTING LIPID AND LIVER PANEL ; YOU WILL NEED TO HAVE THIS LAB WORK BEFORE YOU SEE THE LIPID CLINIC  Testing/Procedures: Your physician has requested that you have a lexiscan myoview. For further information please visit [email or web address]. Please follow instruction sheet, as given.  A chest x-ray takes a picture of the organs and structures inside the chest, including the heart, lungs, and blood vessels. This test can show several things, including, whether the heart is enlarges; whether fluid is building up in the lungs; and whether pacemaker / defibrillator leads are still in place.   Follow-Up: 1. Your physician wants you to follow-up in: [place] DR. Angelena Form You will receive a reminder letter in the mail two months in advance. If you don't receive a letter, please call our office to schedule the follow-up appointment.  2. A REFERRAL TO THE LIPID CLINIC  Any Other Special Instructions Will Be Listed Below (If Applicable).

## 2015-06-11 ENCOUNTER — Telehealth: Payer: Self-pay | Admitting: *Deleted

## 2015-06-11 ENCOUNTER — Telehealth (HOSPITAL_COMMUNITY): Payer: Self-pay

## 2015-06-11 NOTE — Telephone Encounter (Signed)
Wife notified of cxr results; I expalined that I had tried a few times to reach pt on his phone and when it stopped ringing there was no one there so not sure if call wasn't going through. Wife ok and thank you and that she will let pt know cxr results.

## 2015-06-11 NOTE — Telephone Encounter (Signed)
Left message on voicemail in reference to upcoming appointment scheduled for 06-16-2015. Phone number given for a call back so details instructions can be given. Oletta Lamas, Patricia [place] A

## 2015-06-15 ENCOUNTER — Telehealth (HOSPITAL_COMMUNITY): Payer: Self-pay | Admitting: *Deleted

## 2015-06-15 NOTE — Telephone Encounter (Signed)
Patient given detailed instructions per Myocardial Perfusion Study Information Sheet for test on 06/16/15 at 0730. Patient Notified to arrive 15 minutes early, and that it is imperative to arrive on time for appointment to keep from having the test rescheduled. Patient verbalized understanding. Nikkol Pai, Ranae [place]

## 2015-06-16 ENCOUNTER — Other Ambulatory Visit (INDEPENDENT_AMBULATORY_CARE_PROVIDER_SITE_OTHER): Payer: 59 | Admitting: *Deleted

## 2015-06-16 ENCOUNTER — Ambulatory Visit (HOSPITAL_COMMUNITY): Payer: 59 | Attending: Cardiovascular Disease

## 2015-06-16 DIAGNOSIS — R0789 Other chest pain: Secondary | ICD-10-CM | POA: Diagnosis not present

## 2015-06-16 DIAGNOSIS — I2510 Atherosclerotic heart disease of native coronary artery without angina pectoris: Secondary | ICD-10-CM

## 2015-06-16 LAB — HEPATIC FUNCTION PANEL
ALK PHOS: 79 U/L (ref 39–117)
ALT: 18 U/L (ref 0–53)
AST: 27 U/L (ref 0–37)
Albumin: 4.3 g/dL (ref 3.5–5.2)
BILIRUBIN DIRECT: 0.2 mg/dL (ref 0.0–0.3)
TOTAL PROTEIN: 7.4 g/dL (ref 6.0–8.3)
Total Bilirubin: 1 mg/dL (ref 0.2–1.2)

## 2015-06-16 LAB — MYOCARDIAL PERFUSION IMAGING
CHL CUP MPHR: 176 {beats}/min
CHL CUP NUCLEAR SRS: 1
CHL CUP NUCLEAR SSS: 1
CSEPED: 13 min
CSEPHR: 100 %
Estimated workload: 15.1 METS
Exercise duration (sec): 0 s
LHR: 0.28
LV sys vol: 62 mL
LVDIAVOL: 125 mL
NUC STRESS TID: 1.01
Peak HR: 176 {beats}/min
Rest HR: 56 {beats}/min
SDS: 0

## 2015-06-16 LAB — LIPID PANEL
CHOLESTEROL: 171 mg/dL (ref 0–200)
HDL: 58.7 mg/dL (ref >39.00)
LDL Cholesterol: 101 mg/dL — ABNORMAL HIGH (ref 0–99)
NonHDL: 111.85
TRIGLYCERIDES: 54 mg/dL (ref 0.0–149.0)
Total CHOL/HDL Ratio: 3
VLDL: 10.8 mg/dL (ref 0.0–40.0)

## 2015-06-16 MED ORDER — TECHNETIUM TC 99M SESTAMIBI GENERIC - CARDIOLITE
32.8000 | Freq: Once | INTRAVENOUS | Status: AC | PRN
  Administered 2015-06-16: 32.8 via INTRAVENOUS

## 2015-06-16 MED ORDER — TECHNETIUM TC 99M SESTAMIBI GENERIC - CARDIOLITE
10.8000 | Freq: Once | INTRAVENOUS | Status: AC | PRN
  Administered 2015-06-16: 11 via INTRAVENOUS

## 2015-06-17 ENCOUNTER — Telehealth: Payer: Self-pay | Admitting: *Deleted

## 2015-06-17 ENCOUNTER — Encounter: Payer: Self-pay | Admitting: Physician Assistant

## 2015-06-17 DIAGNOSIS — I2510 Atherosclerotic heart disease of native coronary artery without angina pectoris: Secondary | ICD-10-CM

## 2015-06-17 NOTE — Telephone Encounter (Signed)
DPR wife notified of lab and myoview results. Notified need echo to assess EF more accurately and to keep 8/25 appt w/lipid clinic that we need to get LDL <70. I will [facility] call to sched echo. Pt can cb if any questions.

## 2015-06-25 ENCOUNTER — Ambulatory Visit (HOSPITAL_COMMUNITY): Payer: 59 | Attending: Cardiology

## 2015-06-25 ENCOUNTER — Ambulatory Visit (INDEPENDENT_AMBULATORY_CARE_PROVIDER_SITE_OTHER): Payer: 59 | Admitting: Pharmacist

## 2015-06-25 ENCOUNTER — Other Ambulatory Visit: Payer: Self-pay

## 2015-06-25 DIAGNOSIS — E7800 Pure hypercholesterolemia, unspecified: Secondary | ICD-10-CM

## 2015-06-25 DIAGNOSIS — I340 Nonrheumatic mitral (valve) insufficiency: Secondary | ICD-10-CM | POA: Insufficient documentation

## 2015-06-25 DIAGNOSIS — I2510 Atherosclerotic heart disease of native coronary artery without angina pectoris: Secondary | ICD-10-CM | POA: Insufficient documentation

## 2015-06-25 DIAGNOSIS — F17200 Nicotine dependence, unspecified, uncomplicated: Secondary | ICD-10-CM | POA: Diagnosis not present

## 2015-06-25 DIAGNOSIS — I371 Nonrheumatic pulmonary valve insufficiency: Secondary | ICD-10-CM | POA: Diagnosis not present

## 2015-06-25 DIAGNOSIS — I071 Rheumatic tricuspid insufficiency: Secondary | ICD-10-CM | POA: Insufficient documentation

## 2015-06-25 MED ORDER — ATORVASTATIN CALCIUM 40 MG PO TABS: 40.0000 mg | ORAL_TABLET | Freq: Every day | ORAL | Status: DC

## 2015-06-25 NOTE — Patient Instructions (Signed)
Please pick up your prescription for atorvastatin 40mg  once daily Start meal planning and planning breakfasts at home - oatmeal and hard boiled eggs Work on bringing your lunch to work with you - sandwiches, fruit, salads, granola bars Try to start going back to the gym when you can, a few days a week Let us know if you have any problems - lipid clinic # [phone] Follow up with labs in 3 months on Monday, November 14 (lab opens at 7:30, come in any time after for fasting lipid panel)

## 2015-06-25 NOTE — Progress Notes (Signed)
Patient ID: Sean Morris             DOB: 09/27/1971, 44 yo                MRN: [id number]     HPI: Sean Morris is a 44 y.o. male patient of Dr. Angelena Form referred to lipid clinic by Dr. Kathlen Mody. PMH is significant for CAD s/p BMS placed to the LAD in 2012 and HLD. Patient has an intolerance to pravastatin when he had a rash-like reaction. Crestor was also listed as an intolerance with the same reaction, although patient reports that he just stopped taking Crestor because he felt like it, not because he had any reaction. He has never had any myalgias and is not opposed to retrying statin therapy.  Current Medications: none Intolerances: pravastatin - rash-like reaction, reports he had a burning sensation and a few raised bumps that lasted for 4 days Risk Factors: CAD s/p BMS placement, sex LDL goal: 70mg /dL  Diet: Patient eats [place] every morning for breakfast - biscuit with egg, bacon, sausage, sometimes has hash browns. Snacks on chips. Dinner at home is usually fried chicken or pork chops, beans, rice, or broccoli. Wife states that they like to fry everything although she knows that this isn't healthy.  Exercise: Patient used to work out at [employer] 4x/week (treadmill and weights) however pt and his wife report that they have been having some financial trouble. After they pay their bills, he reports that going back to the gym is at the top of his priority list. His wife also wants him to start going for daily walks. He does get some exercise at work which involves driving and lifting boxes.  Family History: Heart attack in his paternal grandfather  Social History: Patient reports that he has quit smoking. He has never used smokeless tobacco. He drinks alcohol and uses illicit drugs (marijuana)  Labs: 06/2015: TC 171, TG 54, HDL 58.7, LDL 101 (no therapy) 03/2012: TC 166, TG 79, HDL 65, LDL 85 (no therapy)  Past Medical History  Diagnosis Date  . CAD (coronary artery disease)    s/p 3.5 x 15 mm bare metal stent mid LAD April 2012  . Hemorrhoids   . GERD (gastroesophageal reflux disease)   . Hyperlipidemia   . Plantar fasciitis   . History of echocardiogram     Echo 6/14: Mild LVH, EF 55-65%, normal wall motion  . History of cardiovascular stress test     ETT-Myoview 8/16: No ischemia or scar, EF 51%; Low Risk     Current Outpatient Prescriptions on File Prior to Visit  Medication Sig Dispense Refill  . aspirin 81 MG tablet Take 81 mg by mouth daily.       No current facility-administered medications on file prior to visit.    Allergies  Allergen Reactions  . Crestor [Rosuvastatin Calcium] Rash  . Pravastatin Itching and Rash    Assessment/Plan: 1. Hyperlipidemia - Patient's LDL currently elevated at 101mg /dL above goal < 70mg /dL given history of CAD and BMS placement to LAD in 2012. He is currently on no lipid-lowering therapy and reports a rash with pravastatin, however he denies myalgias. He did not have a rash with Crestor, but reports that he stopped taking it because he felt like it. Focused a majority of the visit discussing dietary changes with pt and his wife. He drives long hours for work and eats fast food on a daily basis because it's quicker. Discussed healthier options for  eating breakfast at home and came up with a specific dietary plan: instead of [place] for breakfast, patient will start eating oatmeal and hard boiled eggs. He also likes fruit, yogurt, and cereal and may try these options as well. His wife plans to start packing lunches for him that included deli meat sandwiches, fruit, nuts, and granola bars. They will try to stop frying as much food for dinner - discussed that baking and grilling are healthier options. In addition to dietary changes, will also start atorvastatin 40mg  daily. Patient instructed to call clinic if he has any problems tolerating statin. F/u labs in 3 months.   Megan E. Supple, PharmD [facility] [address] [place], [place] [address] Phone: [phone]; Fax: [phone] 06/25/2015 1:14 PM

## 2015-06-26 ENCOUNTER — Telehealth: Payer: Self-pay | Admitting: *Deleted

## 2015-06-26 ENCOUNTER — Encounter: Payer: Self-pay | Admitting: Physician Assistant

## 2015-06-26 NOTE — Telephone Encounter (Signed)
Pt notified of echo results by phone with verbal understanding 

## 2015-09-14 ENCOUNTER — Other Ambulatory Visit: Payer: 59

## 2015-12-21 ENCOUNTER — Ambulatory Visit (INDEPENDENT_AMBULATORY_CARE_PROVIDER_SITE_OTHER): Payer: Managed Care, Other (non HMO) | Admitting: Cardiovascular Disease

## 2015-12-21 ENCOUNTER — Encounter: Payer: Self-pay | Admitting: *Deleted

## 2015-12-21 ENCOUNTER — Encounter: Payer: Self-pay | Admitting: Cardiovascular Disease

## 2015-12-21 VITALS — BP 110/80 | HR 52 | Resp 12 | Ht 71.0 in | Wt 153.6 lb

## 2015-12-21 DIAGNOSIS — E785 Hyperlipidemia, unspecified: Secondary | ICD-10-CM | POA: Diagnosis not present

## 2015-12-21 DIAGNOSIS — I2510 Atherosclerotic heart disease of native coronary artery without angina pectoris: Secondary | ICD-10-CM | POA: Diagnosis not present

## 2015-12-21 NOTE — Patient Instructions (Signed)
Medication Instructions:  Your physician recommends that you continue on your current medications as directed. Please refer to the Current Medication list given to you today.   Labwork: Your physician recommends that you return for fasting lab work on 12/25/15 (lipid and liver profiles).  The lab opens at 7:30 AM   Testing/Procedures: none  Follow-Up: Your physician wants you to follow-up in: 12 months.  You will receive a reminder letter in the mail two months in advance. If you don't receive a letter, please call our office to schedule the follow-up appointment.   Any Other Special Instructions Will Be Listed Below (If Applicable).     If you need a refill on your cardiac medications before your next appointment, please call your pharmacy.

## 2015-12-21 NOTE — Progress Notes (Signed)
Chief Complaint  Patient presents with  . Follow-up  . Coronary Artery Disease      History of Present Illness: 45 yo AAM with history of tobacco abuse and CAD here today for cardiac follow up. He was admitted to [facility] 02/07/11 with c/o chest pain and found to have severe mid LAD stenosis. A 3.5 x 15 mm Vision bare metal stent was placed in the mid LAD. The other vessels were free of disease. LV function was normal. Echo August 2016 with normal LV function. Exercise nuclear stress test August 2016 with no ischemia. He has had a rash with Pravastatin but has tolerated Lipitor.   He is here today for follow up. He is feeling well. No chest pain, SOB, palpitations, near syncope or syncope.   Primary Care Physician: Scarlette Calico  Past Medical History  Diagnosis Date  . CAD (coronary artery disease)     s/p 3.5 x 15 mm bare metal stent mid LAD April 2012  . Hemorrhoids   . GERD (gastroesophageal reflux disease)   . Hyperlipidemia   . Plantar fasciitis   . History of echocardiogram     Echo 6/14: Mild LVH, EF 55-65%, normal wall motion  . History of cardiovascular stress test     ETT-Myoview 8/16: No ischemia or scar, EF 51%; Low Risk  . History of echocardiogram     Echo 8/16:  EF 99991111, normal diastolic function, no RWMA    Past Surgical History  Procedure Laterality Date  . Coronary stent placement    . Plantar fasciitis surgery    . Extraction of wisdom teeth      Current Outpatient Prescriptions  Medication Sig Dispense Refill  . aspirin 81 MG tablet Take 81 mg by mouth daily.      Marland Kitchen atorvastatin (LIPITOR) 40 MG tablet Take 1 tablet (40 mg total) by mouth daily. 30 tablet 11   No current facility-administered medications for this visit.    Allergies  Allergen Reactions  . Crestor [Rosuvastatin Calcium] Rash  . Pravastatin Itching and Rash    Social History   Social History  . Marital Status: Married    Spouse Name: N/A  . Number of Children: N/A   . Years of Education: N/A   Occupational History  . [employer]    Social History Main Topics  . Smoking status: Former Smoker -- 0.50 packs/day for 20 years  . Smokeless tobacco: Never Used     Comment: pt stopped two days ago  . Alcohol Use: 0.0 oz/week     Comment: 1-2 drinks daily   . Drug Use: Yes    Special: Marijuana  . Sexual Activity: Yes   Other Topics Concern  . Not on file   Social History Narrative   Daily caffeine     Family History  Problem Relation Age of Onset  . Diabetes Mother   . Hypertension Mother   . Arthritis Mother   . Colon polyps Mother   . Heart attack Paternal Grandfather   . Cancer Neg Hx   . Alcohol abuse Neg Hx   . Stroke Neg Hx   . Heart disease Neg Hx   . Hyperlipidemia Neg Hx   . Kidney disease Neg Hx   . Colon cancer Neg Hx   . Esophageal cancer Neg Hx   . Rectal cancer Neg Hx   . Stomach cancer Neg Hx     Review of Systems:  As stated in the HPI and otherwise negative.   BP 110/80 mmHg  Pulse 52  Resp 12  Ht 5\' 11"  (1.803 m)  Wt 153 lb 9.6 oz (69.673 kg)  BMI 21.43 kg/m2  Physical Examination: General: Well developed, well nourished, NAD HEENT: OP clear, mucus membranes moist SKIN: warm, dry. No rashes. Neuro: No focal deficits Musculoskeletal: Muscle strength 5/5 all ext Psychiatric: Mood and affect normal Neck: No JVD, no carotid bruits, no thyromegaly, no lymphadenopathy. Lungs:Clear bilaterally, no wheezes, rhonci, crackles Cardiovascular: Regular rate and rhythm. No murmurs, gallops or rubs. Abdomen:Soft. Bowel sounds present. Non-tender.  Extremities: No lower extremity edema. Pulses are 2 + in the bilateral DP/PT.  Echo August 2016: Left ventricle: The cavity size was normal. Wall thickness was normal. Systolic function was normal. The estimated ejection fraction was in the range of 50% to 55%. Wall motion was normal; there were no regional wall motion  abnormalities. Left ventricular diastolic function parameters were normal. Impressions: - Normal LV systolic and diastolic function; trace MR and TR  EKG:  EKG is ordered today. The ekg ordered today demonstrates Sinus brady, rate 52 bpm.   Recent Labs: 06/16/2015: ALT 18   Lipid Panel    Component Value Date/Time   CHOL 171 06/16/2015 0733   TRIG 54.0 06/16/2015 0733   HDL 58.70 06/16/2015 0733   CHOLHDL 3 06/16/2015 0733   VLDL 10.8 06/16/2015 0733   LDLCALC 101* 06/16/2015 0733     Wt Readings from Last 3 Encounters:  12/21/15 153 lb 9.6 oz (69.673 kg)  06/16/15 151 lb (68.493 kg)  06/10/15 151 lb 9.6 oz (68.765 kg)     Other studies Reviewed: Additional studies/ records that were reviewed today include: . Review of the above records demonstrates:    Assessment and Plan:   1. CAD: Stable. He is on an ASA and a statin. No beta blocker secondary to bradycardia. I personally reviewed his EKG today.   2. HLD: He is on Lipitor 40 mg daily and tolerating well. Will repeat lipids and LFTs now.     Current medicines are reviewed at length with the patient today.  The patient does not have concerns regarding medicines.  The following changes have been made:  no change  Labs/ tests ordered today include:   Orders Placed This Encounter  Procedures  . Lipid Profile  . Hepatic function panel  . EKG 12-Lead    Disposition:   FU with me in 12  months  Signed, Lauree Chandler, MD 12/21/2015 10:39 AM    [facility] [address], [place], [place]  [address] Phone: [phone]; Fax: [phone]

## 2015-12-25 ENCOUNTER — Other Ambulatory Visit: Payer: Managed Care, Other (non HMO)

## 2015-12-29 ENCOUNTER — Other Ambulatory Visit: Payer: Managed Care, Other (non HMO)

## 2016-03-18 IMAGING — NM NM MYOCAR MULTI W/ SPECT
4 series · 24 of 24 positions shown · non-contrast
Comparison: none

[Series 1: rest · 6.51mm/px · 6 of 64 frames shown (1 of 2)]
[frame 6/64]
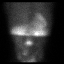
[frame 16/64]
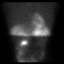
[frame 27/64]
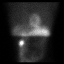
[frame 38/64]
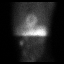
[frame 48/64]
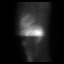
[frame 59/64]
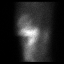

[Series 2: rest · 6.51mm/px · 6 of 64 frames shown (2 of 2)]
[frame 6/64]
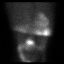
[frame 16/64]
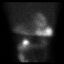
[frame 27/64]
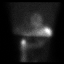
[frame 38/64]
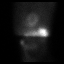
[frame 48/64]
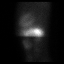
[frame 59/64]
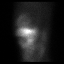

[Series 3: stress · 6.51mm/px · 6 of 512 frames shown (1 of 2)]
[frame 43/512]
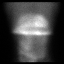
[frame 128/512]
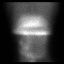
[frame 214/512]
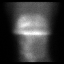
[frame 299/512]
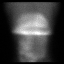
[frame 384/512]
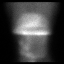
[frame 470/512]
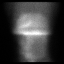

[Series 3: stress · 6.51mm/px · 6 of 64 frames shown (2 of 2)]
[frame 6/64]
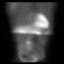
[frame 16/64]
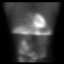
[frame 27/64]
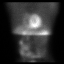
[frame 38/64]
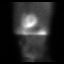
[frame 48/64]
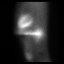
[frame 59/64]
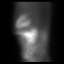

[24 of 24 positions shown; findings below may reference images not displayed]

Canned report from images found in remote index.

Refer to host system for actual result text.

## 2016-09-29 ENCOUNTER — Other Ambulatory Visit: Payer: Self-pay | Admitting: Cardiovascular Disease

## 2016-11-02 ENCOUNTER — Encounter: Payer: Managed Care, Other (non HMO) | Admitting: Internal Medicine

## 2016-11-25 ENCOUNTER — Encounter: Payer: Self-pay | Admitting: *Deleted

## 2016-11-25 ENCOUNTER — Encounter (INDEPENDENT_AMBULATORY_CARE_PROVIDER_SITE_OTHER): Payer: Self-pay

## 2016-11-25 ENCOUNTER — Encounter: Payer: Self-pay | Admitting: Cardiovascular Disease

## 2016-11-25 ENCOUNTER — Ambulatory Visit (INDEPENDENT_AMBULATORY_CARE_PROVIDER_SITE_OTHER): Payer: PRIVATE HEALTH INSURANCE | Admitting: Cardiovascular Disease

## 2016-11-25 VITALS — BP 130/90 | HR 60 | Ht 72.0 in | Wt 150.8 lb

## 2016-11-25 DIAGNOSIS — E785 Hyperlipidemia, unspecified: Secondary | ICD-10-CM

## 2016-11-25 DIAGNOSIS — I2510 Atherosclerotic heart disease of native coronary artery without angina pectoris: Secondary | ICD-10-CM

## 2016-11-25 MED ORDER — ATORVASTATIN CALCIUM 40 MG PO TABS: 40.0000 mg | ORAL_TABLET | Freq: Every day | ORAL | 11 refills | Status: DC

## 2016-11-25 MED ORDER — ASPIRIN EC 81 MG PO TBEC: 81.0000 mg | DELAYED_RELEASE_TABLET | Freq: Every day | ORAL | 3 refills | Status: DC

## 2016-11-25 NOTE — Patient Instructions (Signed)
Medication Instructions:  Your physician has recommended you make the following change in your medication:  Start aspirin 81 mg by mouth daily. Start atorvastatin 40 mg by mouth daily.    Labwork: Your physician recommends that you return for lab work in: 12 weeks.  This is  scheduled for April 16,2018.  This will be fasting lab work.  The lab opens at 7:30 AM   Testing/Procedures: none  Follow-Up: Your physician recommends that you schedule a follow-up appointment in: 12 months. Please call our office in about 9 months to schedule this appt    Any Other Special Instructions Will Be Listed Below (If Applicable).     If you need a refill on your cardiac medications before your next appointment, please call your pharmacy.

## 2016-11-25 NOTE — Progress Notes (Signed)
Chief Complaint  Patient presents with  . Follow-up      History of Present Illness: 46 yo AAM with history of tobacco abuse and CAD here today for cardiac follow up. He was admitted to [facility] 02/07/11 with unstable angina and was found to have severe mid LAD stenosis. A 3.5 x 15 mm Vision bare metal stent was placed in the mid LAD. The other vessels were free of disease. LV function was normal. Echo August 2016 with normal LV function. Exercise nuclear stress test August 2016 with no ischemia. He has had a rash with Pravastatin but has tolerated Lipitor.   He is here today for follow up. He is feeling well. No chest pain, SOB, palpitations, near syncope or syncope. He has started back smoking.   Primary Care Physician: Scarlette Calico, MD  Past Medical History:  Diagnosis Date  . CAD (coronary artery disease)    s/p 3.5 x 15 mm bare metal stent mid LAD April 2012  . GERD (gastroesophageal reflux disease)   . Hemorrhoids   . History of cardiovascular stress test    ETT-Myoview 8/16: No ischemia or scar, EF 51%; Low Risk  . History of echocardiogram    Echo 6/14: Mild LVH, EF 55-65%, normal wall motion  . History of echocardiogram    Echo 8/16:  EF 99991111, normal diastolic function, no RWMA  . Hyperlipidemia   . Plantar fasciitis     Past Surgical History:  Procedure Laterality Date  . CORONARY STENT PLACEMENT    . extraction of wisdom teeth    . Plantar fasciitis surgery      Current Outpatient Prescriptions  Medication Sig Dispense Refill  . aspirin EC 81 MG tablet Take 1 tablet (81 mg total) by mouth daily. 90 tablet 3  . atorvastatin (LIPITOR) 40 MG tablet Take 1 tablet (40 mg total) by mouth daily. 30 tablet 11   No current facility-administered medications for this visit.     Allergies  Allergen Reactions  . Crestor [Rosuvastatin Calcium] Rash  . Pravastatin Itching and Rash    Social History   Social History  . Marital status: Married    Spouse  name: N/A  . Number of children: N/A  . Years of education: N/A   Occupational History  . [employer]    Social History Main Topics  . Smoking status: Former Smoker    Packs/day: 0.50    Years: 20.00  . Smokeless tobacco: Never Used     Comment: pt stopped two days ago  . Alcohol use 0.0 oz/week     Comment: 1-2 drinks daily   . Drug use: Yes    Types: Marijuana  . Sexual activity: Yes   Other Topics Concern  . Not on file   Social History Narrative   Daily caffeine     Family History  Problem Relation Age of Onset  . Diabetes Mother   . Hypertension Mother   . Arthritis Mother   . Colon polyps Mother   . Heart attack Paternal Grandfather   . Cancer Neg Hx   . Alcohol abuse Neg Hx   . Stroke Neg Hx   . Heart disease Neg Hx   . Hyperlipidemia Neg Hx   . Kidney disease Neg Hx   . Colon cancer Neg Hx   . Esophageal cancer Neg Hx   . Rectal cancer Neg Hx   . Stomach cancer Neg Hx  Review of Systems:  As stated in the HPI and otherwise negative.   BP 130/90   Pulse 60   Ht 6' (1.829 m)   Wt 150 lb 12.8 oz (68.4 kg)   BMI 20.45 kg/m   Physical Examination: General: Well developed, well nourished, NAD  HEENT: OP clear, mucus membranes moist  SKIN: warm, dry. No rashes. Neuro: No focal deficits  Musculoskeletal: Muscle strength 5/5 all ext  Psychiatric: Mood and affect normal  Neck: No JVD, no carotid bruits, no thyromegaly, no lymphadenopathy.  Lungs:Clear bilaterally, no wheezes, rhonci, crackles Cardiovascular: Regular rate and rhythm. No murmurs, gallops or rubs. Abdomen:Soft. Bowel sounds present. Non-tender.  Extremities: No lower extremity edema. Pulses are 2 + in the bilateral DP/PT.  Echo August 2016: Left ventricle: The cavity size was normal. Wall thickness was normal. Systolic function was normal. The estimated ejection fraction was in the range of 50% to 55%. Wall motion was normal; there were no  regional wall motion abnormalities. Left ventricular diastolic function parameters were normal. Impressions: - Normal LV systolic and diastolic function; trace MR and TR  EKG:  EKG is not ordered today. The ekg ordered today demonstrates   Recent Labs: No results found for requested labs within last 8760 hours.   Lipid Panel    Component Value Date/Time   CHOL 171 06/16/2015 0733   TRIG 54.0 06/16/2015 0733   HDL 58.70 06/16/2015 0733   CHOLHDL 3 06/16/2015 0733   VLDL 10.8 06/16/2015 0733   LDLCALC 101 (H) 06/16/2015 0733     Wt Readings from Last 3 Encounters:  11/25/16 150 lb 12.8 oz (68.4 kg)  12/21/15 153 lb 9.6 oz (69.7 kg)  06/16/15 151 lb (68.5 kg)     Other studies Reviewed: Additional studies/ records that were reviewed today include: . Review of the above records demonstrates:    Assessment and Plan:   1. CAD without angina: He is having no chest pain suggestive of angina. He had been on ASA and a statin but he has stopped taking it. No beta blocker secondary to bradycardia. Will restart ASA and statin.  2. HLD: Restart Lipitor 40 mg daily. Lipids and LFTs in 12 weeks.     Current medicines are reviewed at length with the patient today.  The patient does not have concerns regarding medicines.  The following changes have been made:  no change  Labs/ tests ordered today include:   Orders Placed This Encounter  Procedures  . Lipid Profile  . Hepatic function panel  . EKG 12-Lead    Disposition:   FU with me in 12  months  Signed, Lauree Chandler, MD 11/25/2016 1:54 PM    [facility] [place] [address], [place], [place]  [address] Phone: [phone]; Fax: [phone]

## 2016-11-30 ENCOUNTER — Ambulatory Visit (INDEPENDENT_AMBULATORY_CARE_PROVIDER_SITE_OTHER): Payer: BLUE CROSS/BLUE SHIELD | Admitting: Internal Medicine

## 2016-11-30 ENCOUNTER — Other Ambulatory Visit (INDEPENDENT_AMBULATORY_CARE_PROVIDER_SITE_OTHER): Payer: BLUE CROSS/BLUE SHIELD

## 2016-11-30 ENCOUNTER — Encounter: Payer: Self-pay | Admitting: Internal Medicine

## 2016-11-30 VITALS — BP 130/82 | HR 54 | Temp 98.6°F | Resp 12 | Ht 72.0 in | Wt 153.0 lb

## 2016-11-30 DIAGNOSIS — K648 Other hemorrhoids: Secondary | ICD-10-CM | POA: Diagnosis not present

## 2016-11-30 DIAGNOSIS — Z0000 Encounter for general adult medical examination without abnormal findings: Secondary | ICD-10-CM

## 2016-11-30 DIAGNOSIS — I2510 Atherosclerotic heart disease of native coronary artery without angina pectoris: Secondary | ICD-10-CM

## 2016-11-30 DIAGNOSIS — K921 Melena: Secondary | ICD-10-CM | POA: Diagnosis not present

## 2016-11-30 LAB — COMPREHENSIVE METABOLIC PANEL
ALT: 16 U/L (ref 0–53)
AST: 23 U/L (ref 0–37)
Albumin: 4.3 g/dL (ref 3.5–5.2)
Alkaline Phosphatase: 77 U/L (ref 39–117)
BILIRUBIN TOTAL: 0.5 mg/dL (ref 0.2–1.2)
BUN: 15 mg/dL (ref 6–23)
CO2: 31 meq/L (ref 19–32)
Calcium: 9.4 mg/dL (ref 8.4–10.5)
Chloride: 102 mEq/L (ref 96–112)
Creatinine, Ser: 1.13 mg/dL (ref 0.40–1.50)
GFR: 89.75 mL/min (ref >60.00)
GLUCOSE: 100 mg/dL — AB (ref 70–99)
Potassium: 4.1 mEq/L (ref 3.5–5.1)
SODIUM: 140 meq/L (ref 135–145)
Total Protein: 7 g/dL (ref 6.0–8.3)

## 2016-11-30 LAB — PSA: PSA: 1.29 ng/mL (ref 0.10–4.00)

## 2016-11-30 LAB — CBC WITH DIFFERENTIAL/PLATELET
BASOS ABS: 0 10*3/uL (ref 0.0–0.1)
Basophils Relative: 0.7 % (ref 0.0–3.0)
EOS PCT: 2.1 % (ref 0.0–5.0)
Eosinophils Absolute: 0.1 10*3/uL (ref 0.0–0.7)
HCT: 46.8 % (ref 39.0–52.0)
Hemoglobin: 15.6 g/dL (ref 13.0–17.0)
LYMPHS ABS: 2.3 10*3/uL (ref 0.7–4.0)
Lymphocytes Relative: 35.2 % (ref 12.0–46.0)
MCHC: 33.4 g/dL (ref 30.0–36.0)
MCV: 91.8 fl (ref 78.0–100.0)
MONOS PCT: 8.1 % (ref 3.0–12.0)
Monocytes Absolute: 0.5 10*3/uL (ref 0.1–1.0)
NEUTROS ABS: 3.5 10*3/uL (ref 1.4–7.7)
NEUTROS PCT: 53.9 % (ref 43.0–77.0)
PLATELETS: 248 10*3/uL (ref 150.0–400.0)
RBC: 5.1 Mil/uL (ref 4.22–5.81)
RDW: 15.5 % (ref 11.5–15.5)
WBC: 6.6 10*3/uL (ref 4.0–10.5)

## 2016-11-30 LAB — TSH: TSH: 0.81 u[IU]/mL (ref 0.35–4.50)

## 2016-11-30 MED ORDER — HYDROCORTISONE ACE-PRAMOXINE 1-1 % RE FOAM: 1.0000 | Freq: Two times a day (BID) | RECTAL | 1 refills | Status: DC

## 2016-11-30 NOTE — Patient Instructions (Signed)

## 2016-11-30 NOTE — Progress Notes (Signed)
Pre visit review using our clinic review tool, if applicable. No additional management support is needed unless otherwise documented below in the visit note. 

## 2016-11-30 NOTE — Progress Notes (Signed)
Subjective:  Patient ID: Sean Morris, male    DOB: 20-Jul-1971  Age: 46 y.o. MRN: [id number]  CC: Annual Exam and Rectal Bleeding   HPI WALES LARRISON presents for a CPX.  He complains of a several month history of rectal bleeding and anal irritation. He denies abdominal pain, nausea, vomiting, weight loss, diarrhea, or constipation.   Outpatient Medications Prior to Visit  Medication Sig Dispense Refill  . aspirin EC 81 MG tablet Take 1 tablet (81 mg total) by mouth daily. 90 tablet 3  . atorvastatin (LIPITOR) 40 MG tablet Take 1 tablet (40 mg total) by mouth daily. 30 tablet 11   No facility-administered medications prior to visit.     ROS Review of Systems  Constitutional: Negative for appetite change, diaphoresis, fatigue and unexpected weight change.  HENT: Negative.   Eyes: Negative for visual disturbance.  Respiratory: Negative for cough, chest tightness, shortness of breath and wheezing.   Cardiovascular: Negative for chest pain, palpitations and leg swelling.  Gastrointestinal: Positive for anal bleeding, blood in stool and rectal pain. Negative for abdominal distention, abdominal pain, constipation, diarrhea, nausea and vomiting.  Endocrine: Negative.   Genitourinary: Negative.  Negative for decreased urine volume, difficulty urinating, discharge, dysuria, flank pain, frequency, hematuria, penile swelling, scrotal swelling, testicular pain and urgency.  Musculoskeletal: Negative for back pain and myalgias.  Skin: Negative.  Negative for color change and pallor.  Allergic/Immunologic: Negative.   Neurological: Negative.  Negative for dizziness, weakness, light-headedness, numbness and headaches.  Hematological: Negative.  Negative for adenopathy. Does not bruise/bleed easily.  Psychiatric/Behavioral: Negative.     Objective:  BP 130/82 (BP Location: Left Arm, Patient Position: Sitting, Cuff Size: Normal)   Pulse (!) 54   Temp 98.6 F (37 C) (Oral)   Resp 12    Ht 6' (1.829 m)   Wt 153 lb (69.4 kg)   SpO2 99%   BMI 20.75 kg/m   BP Readings from Last 3 Encounters:  11/30/16 130/82  11/25/16 130/90  12/21/15 110/80    Wt Readings from Last 3 Encounters:  11/30/16 153 lb (69.4 kg)  11/25/16 150 lb 12.8 oz (68.4 kg)  12/21/15 153 lb 9.6 oz (69.7 kg)    Physical Exam  Constitutional: He is oriented to person, place, and time. No distress.  HENT:  Mouth/Throat: Oropharynx is clear and moist. No oropharyngeal exudate.  Eyes: Conjunctivae are normal. Right eye exhibits no discharge. Left eye exhibits no discharge. No scleral icterus.  Neck: Normal range of motion. Neck supple. No JVD present. No tracheal deviation present. No thyromegaly present.  Cardiovascular: Normal rate, regular rhythm, normal heart sounds and intact distal pulses.  Exam reveals no gallop and no friction rub.   No murmur heard. Pulmonary/Chest: Effort normal and breath sounds normal. No stridor. No respiratory distress. He has no wheezes. He has no rales. He exhibits no tenderness.  Abdominal: Soft. Bowel sounds are normal. He exhibits no distension and no mass. There is no tenderness. There is no rebound and no guarding. Hernia confirmed negative in the right inguinal area and confirmed negative in the left inguinal area.  Genitourinary: Prostate normal, testes normal and penis normal. Rectal exam shows internal hemorrhoid. Rectal exam shows no external hemorrhoid, no fissure, no mass, no tenderness, anal tone normal and guaiac negative stool. Prostate is not enlarged and not tender. Right testis shows no mass, no swelling and no tenderness. Right testis is descended. Left testis shows no mass, no swelling and no tenderness.  Left testis is descended. Circumcised. No penile erythema or penile tenderness. No discharge found.  Musculoskeletal: Normal range of motion. He exhibits no edema, tenderness or deformity.  Lymphadenopathy:    He has no cervical adenopathy.       Right: No  inguinal adenopathy present.       Left: No inguinal adenopathy present.  Neurological: He is oriented to person, place, and time.  Skin: Skin is warm and dry. No rash noted. He is not diaphoretic. No erythema. No pallor.  Vitals reviewed.   Lab Results  Component Value Date   WBC 6.6 11/30/2016   HGB 15.6 11/30/2016   HCT 46.8 11/30/2016   PLT 248.0 11/30/2016   GLUCOSE 100 (H) 11/30/2016   CHOL 171 06/16/2015   TRIG 54.0 06/16/2015   HDL 58.70 06/16/2015   LDLCALC 101 (H) 06/16/2015   ALT 16 11/30/2016   AST 23 11/30/2016   NA 140 11/30/2016   K 4.1 11/30/2016   CL 102 11/30/2016   CREATININE 1.13 11/30/2016   BUN 15 11/30/2016   CO2 31 11/30/2016   TSH 0.81 11/30/2016   PSA 1.29 11/30/2016   INR 0.98 02/07/2011   HGBA1C  02/06/2011    5.5 (NOTE)                                                                       According to the ADA Clinical Practice Recommendations for 2011, when HbA1c is used as a screening test:   >=6.5%   Diagnostic of Diabetes Mellitus           (if abnormal result  is confirmed)  5.7-6.4%   Increased risk of developing Diabetes Mellitus  References:Diagnosis and Classification of Diabetes Mellitus,Diabetes [id number] 1):S62-S69 and Standards of Medical Care in         Diabetes - 2011,Diabetes Care,2011,34  (Suppl 1):S11-S61.    Dg Chest 2 View  Result Date: 06/10/2015 CLINICAL DATA:  One week of chest pain, history of stent placement, nonsmoker. EXAM: CHEST  2 VIEW COMPARISON:  PA and lateral chest x-ray of February 16, 2011 FINDINGS: The lungs are adequately inflated and clear. The heart and pulmonary vascularity are normal. A cardiac stent is visible on the lateral view The mediastinum is normal in width. There is no pleural effusion. The bony thorax is unremarkable. IMPRESSION: There is no active cardiopulmonary disease. Electronically Signed   By: David  Martinique M.D.   On: 06/10/2015 14:56    Assessment & Plan:   [place] was seen today  for annual exam and rectal bleeding.  Diagnoses and all orders for this visit:  Routine general medical examination at a health care facility-- exam completed, labs ordered and reviewed, vaccines reviewed and updated, patient education material was given. -     Comprehensive metabolic panel; Future -     CBC with Differential/Platelet; Future -     TSH; Future -     PSA; Future  Coronary artery disease involving native coronary artery of native heart without angina pectoris- he said no recent episodes of angina, he was recently seen by cardiology and his lipids are monitored and treated by cardiology.  Blood in stool, frank- this is most likely related to hemorrhoidal causes, he will see  GI to see if he can undergo a banding procedure to treat this. -     Ambulatory referral to Gastroenterology  Hemorrhoids, internal, with bleeding- we'll treat the symptoms with Proctofoam HC foam and of asked him to see GI to consider banding procedure -     Ambulatory referral to Gastroenterology -     hydrocortisone-pramoxine (PROCTOFOAM HC) rectal foam; Place 1 applicator rectally 2 (two) times daily.   I am having Mr. Loudy start on hydrocortisone-pramoxine. I am also having him maintain his atorvastatin and aspirin EC.  Meds ordered this encounter  Medications  . hydrocortisone-pramoxine (PROCTOFOAM HC) rectal foam    Sig: Place 1 applicator rectally 2 (two) times daily.    Dispense:  10 g    Refill:  1     Follow-up: Return if symptoms worsen or fail to improve.  Scarlette Calico, MD

## 2016-12-12 ENCOUNTER — Ambulatory Visit: Payer: BLUE CROSS/BLUE SHIELD | Admitting: Gastroenterology

## 2016-12-12 ENCOUNTER — Other Ambulatory Visit: Payer: Self-pay

## 2017-01-11 ENCOUNTER — Encounter (INDEPENDENT_AMBULATORY_CARE_PROVIDER_SITE_OTHER): Payer: Self-pay

## 2017-01-11 ENCOUNTER — Ambulatory Visit (INDEPENDENT_AMBULATORY_CARE_PROVIDER_SITE_OTHER): Payer: BLUE CROSS/BLUE SHIELD | Admitting: Gastroenterology

## 2017-01-11 ENCOUNTER — Encounter: Payer: Self-pay | Admitting: Gastroenterology

## 2017-01-11 VITALS — BP 110/80 | HR 64 | Ht 72.0 in | Wt 155.8 lb

## 2017-01-11 DIAGNOSIS — Z86010 Personal history of colonic polyps: Secondary | ICD-10-CM | POA: Diagnosis not present

## 2017-01-11 DIAGNOSIS — K648 Other hemorrhoids: Secondary | ICD-10-CM | POA: Diagnosis not present

## 2017-01-11 DIAGNOSIS — I2510 Atherosclerotic heart disease of native coronary artery without angina pectoris: Secondary | ICD-10-CM

## 2017-01-11 MED ORDER — NA SULFATE-K SULFATE-MG SULF 17.5-3.13-1.6 GM/177ML PO SOLN: 1.0000 | Freq: Once | ORAL | 0 refills | Status: AC

## 2017-01-11 NOTE — Progress Notes (Signed)
[facility] Consult Note:  History: Sean Morris 01/11/2017  Referring physician: Scarlette Calico, MD  Reason for consult/chief complaint: Rectal Bleeding (with bowel movements, patient strains, BRB when he wipes) and Hemorrhoids (itching at night after taking laxative)   Subjective  HPI:  This is a 46 year old man referred for ongoing rectal bleeding and history of colon polyp. He quit smoking about a month ago and has lately developed constipation with a BM about every other day and sometimes the need to strain. The stool might be quite firm or large. He rarely sees some blood in the toilet bowl, but frequencies are on the paper. He was having hematochezia when he saw Dr. Sharlett Morris in late 2013, colonoscopy revealed a large adenomatous colon polyp and internal hemorrhoids. GERD symptoms at that time were evaluated with an EGD that was normal. Patient states that all of his GERD symptoms resolved after he recently quit smoking.  ROS:  Review of Systems  Constitutional: Negative for appetite change and unexpected weight change.  HENT: Negative for mouth sores and voice change.   Eyes: Negative for pain and redness.  Respiratory: Negative for cough and shortness of breath.   Cardiovascular: Negative for chest pain and palpitations.  Genitourinary: Negative for dysuria and hematuria.  Musculoskeletal: Negative for arthralgias and myalgias.  Skin: Negative for pallor and rash.  Neurological: Negative for weakness and headaches.  Hematological: Negative for adenopathy.     Past Medical History: Past Medical History:  Diagnosis Date  . CAD (coronary artery disease)    s/p 3.5 x 15 mm bare metal stent mid LAD April 2012  . GERD (gastroesophageal reflux disease)   . Hemorrhoids   . History of cardiovascular stress test    ETT-Myoview 8/16: No ischemia or scar, EF 51%; Low Risk  . History of echocardiogram    Echo 6/14: Mild LVH, EF 55-65%, normal wall motion   . History of echocardiogram    Echo 8/16:  EF 71-69%, normal diastolic function, no RWMA  . Hyperlipidemia   . Plantar fasciitis    I reviewed his most recent cardiology office note from January 2018. He had a negative nuclear stress test in 2016, has no angina and is now just on low-dose aspirin.  Past Surgical History: Past Surgical History:  Procedure Laterality Date  . CORONARY STENT PLACEMENT    . extraction of wisdom teeth    . Plantar fasciitis surgery       Family History: Family History  Problem Relation Age of Onset  . Diabetes Mother   . Hypertension Mother   . Arthritis Mother   . Colon polyps Mother   . Cancer Father 30    prostate  . Heart attack Paternal Grandfather   . Alcohol abuse Neg Hx   . Stroke Neg Hx   . Heart disease Neg Hx   . Hyperlipidemia Neg Hx   . Kidney disease Neg Hx   . Colon cancer Neg Hx   . Esophageal cancer Neg Hx   . Rectal cancer Neg Hx   . Stomach cancer Neg Hx     Social History: Social History   Social History  . Marital status: Married    Spouse name: N/A  . Number of children: N/A  . Years of education: N/A   Occupational History  . [employer]    Social History Main Topics  . Smoking status: Former Smoker    Packs/day: 0.50  Years: 20.00  . Smokeless tobacco: Never Used     Comment: pt stopped two days ago  . Alcohol use No     Comment: 1-2 drinks daily   . Drug use: Yes    Types: Marijuana  . Sexual activity: Yes   Other Topics Concern  . None   Social History Narrative   Daily caffeine     Allergies: Allergies  Allergen Reactions  . Crestor [Rosuvastatin Calcium] Rash  . Pravastatin Itching and Rash    Outpatient Meds: Current Outpatient Prescriptions  Medication Sig Dispense Refill  . aspirin EC 81 MG tablet Take 1 tablet (81 mg total) by mouth daily. 90 tablet 3  . atorvastatin (LIPITOR) 40 MG tablet Take 1 tablet (40 mg total) by mouth daily. 30 tablet  11  . hydrocortisone-pramoxine (PROCTOFOAM HC) rectal foam Place 1 applicator rectally 2 (two) times daily. 10 g 1  . Na Sulfate-K Sulfate-Mg Sulf 17.5-3.13-1.6 GM/180ML SOLN Take 1 kit by mouth once. 354 mL 0   No current facility-administered medications for this visit.       ___________________________________________________________________ Objective   Exam:  BP 110/80   Pulse 64   Ht 6' (1.829 m)   Wt 155 lb 12.8 oz (70.7 kg)   BMI 21.13 kg/m    General: this is a(n) Well-appearing middle-aged man   Eyes: sclera anicteric, no redness  ENT: oral mucosa moist without lesions, no cervical or supraclavicular lymphadenopathy, good dentition  CV: RRR without murmur, S1/S2, no JVD, no peripheral edema  Resp: clear to auscultation bilaterally, normal RR and effort noted  GI: soft, no tenderness, with active bowel sounds. No guarding or palpable organomegaly noted.  Skin; warm and dry, no rash or jaundice noted  Neuro: awake, alert and oriented x 3. Normal gross motor function and fluent speech Rectal exam deferred as he will have a colonoscopy in the near future.   Assessment: Encounter Diagnoses  Name Primary?  Marland Kitchen Hx of colonic polyps Yes  . Hemorrhoids, internal, with bleeding   . Coronary artery disease involving native coronary artery of native heart without angina pectoris     He is in need of a surveillance colonoscopy. I had a brief initial discussion about possible hemorrhoidal banding, which we can revisit after his colonoscopy.  Thank you for the courtesy of this consult.  Please call me with any questions or concerns.  Sean Morris  CC: Sean Calico, MD

## 2017-01-11 NOTE — Patient Instructions (Signed)
If you are age 47 or older, your body mass index should be between 23-30. Your Body mass index is 21.13 kg/m. If this is out of the aforementioned range listed, please consider follow up with your Primary Care Provider.  If you are age 52 or younger, your body mass index should be between 19-25. Your Body mass index is 21.13 kg/m. If this is out of the aformentioned range listed, please consider follow up with your Primary Care Provider.   You have been scheduled for a colonoscopy. Please follow written instructions given to you at your visit today.  Please pick up your prep supplies at the pharmacy within the next 1-3 days. If you use inhalers (even only as needed), please bring them with you on the day of your procedure. Your physician has requested that you go to www.startemmi.com and enter the access code given to you at your visit today. This web site gives a general overview about your procedure. However, you should still follow specific instructions given to you by our office regarding your preparation for the procedure.  Thank you for choosing [facility]  Dr Wilfrid Lund III

## 2017-01-24 ENCOUNTER — Encounter: Payer: Self-pay | Admitting: Gastroenterology

## 2017-02-07 ENCOUNTER — Encounter: Payer: Self-pay | Admitting: Gastroenterology

## 2017-02-07 ENCOUNTER — Ambulatory Visit (AMBULATORY_SURGERY_CENTER): Payer: BLUE CROSS/BLUE SHIELD | Admitting: Gastroenterology

## 2017-02-07 VITALS — BP 117/74 | HR 58 | Temp 99.1°F | Resp 11 | Ht 72.0 in | Wt 155.0 lb

## 2017-02-07 DIAGNOSIS — Z86010 Personal history of colonic polyps: Secondary | ICD-10-CM

## 2017-02-07 MED ORDER — SODIUM CHLORIDE 0.9 % IV SOLN: 500.0000 mL | INTRAVENOUS | Status: DC

## 2017-02-07 NOTE — Patient Instructions (Signed)
YOU HAD AN ENDOSCOPIC PROCEDURE TODAY AT THE [facility]:   Refer to the procedure report that was given to you for any specific questions about what was found during the examination.  If the procedure report does not answer your questions, please call your gastroenterologist to clarify.  If you requested that your care partner not be given the details of your procedure findings, then the procedure report has been included in a sealed envelope for you to review at your convenience later.  YOU SHOULD EXPECT: Some feelings of bloating in the abdomen. Passage of more gas than usual.  Walking can help get rid of the air that was put into your GI tract during the procedure and reduce the bloating. If you had a lower endoscopy (such as a colonoscopy or flexible sigmoidoscopy) you may notice spotting of blood in your stool or on the toilet paper. If you underwent a bowel prep for your procedure, you may not have a normal bowel movement for a few days.  Please Note:  You might notice some irritation and congestion in your nose or some drainage.  This is from the oxygen used during your procedure.  There is no need for concern and it should clear up in a day or so.  SYMPTOMS TO REPORT IMMEDIATELY:   Following lower endoscopy (colonoscopy or flexible sigmoidoscopy):  Excessive amounts of blood in the stool  Significant tenderness or worsening of abdominal pains  Swelling of the abdomen that is new, acute  Fever of 100F or higher   For urgent or emergent issues, a gastroenterologist can be reached at any hour by calling [phone].   DIET:  We do recommend a small meal at first, but then you may proceed to your regular diet.  Drink plenty of fluids but you should avoid alcoholic beverages for 24 hours.  ACTIVITY:  You should plan to take it easy for the rest of today and you should NOT DRIVE or use heavy machinery until tomorrow (because of the sedation medicines used during the test).     FOLLOW UP: Our staff will call the number listed on your records the next business day following your procedure to check on you and address any questions or concerns that you may have regarding the information given to you following your procedure. If we do not reach you, we will leave a message.  However, if you are feeling well and you are not experiencing any problems, there is no need to return our call.  We will assume that you have returned to your regular daily activities without incident.  If any biopsies were taken you will be contacted by phone or by letter within the next 1-3 weeks.  Please call us at [phone] if you have not heard about the biopsies in 3 weeks.    SIGNATURES/CONFIDENTIALITY: You and/or your care partner have signed paperwork which will be entered into your electronic medical record.  These signatures attest to the fact that that the information above on your After Visit Summary has been reviewed and is understood.  Full responsibility of the confidentiality of this discharge information lies with you and/or your care-partner.  Thank you for letting us take care of your healthcare needs today. 

## 2017-02-07 NOTE — Op Note (Signed)
[facility] Patient Name: Sean Morris Procedure Date: 02/07/2017 3:05 PM MRN: [id number] Endoscopist: Mallie Mussel L. Loletha Carrow , MD Age: 46 Referring MD:  Date of Birth: 04-07-1971 Gender: Male Account #: [id number] Procedure:                Colonoscopy Indications:              Surveillance: Personal history of adenomatous                            polyps on last colonoscopy > 3 years ago ([facility]                            07/2012) Medicines:                Monitored Anesthesia Care Procedure:                Pre-Anesthesia Assessment:                           - Prior to the procedure, a History and Physical                            was performed, and patient medications and                            allergies were reviewed. The patient's tolerance of                            previous anesthesia was also reviewed. The risks                            and benefits of the procedure and the sedation                            options and risks were discussed with the patient.                            All questions were answered, and informed consent                            was obtained. Prior Anticoagulants: The patient has                            taken no previous anticoagulant or antiplatelet                            agents. ASA Grade Assessment: II - A patient with                            mild systemic disease. After reviewing the risks                            and benefits, the patient was deemed in  satisfactory condition to undergo the procedure.                           After obtaining informed consent, the colonoscope                            was passed under direct vision. Throughout the                            procedure, the patient's blood pressure, pulse, and                            oxygen saturations were monitored continuously. The                            Colonoscope was introduced through the anus and                advanced to the the cecum, identified by                            appendiceal orifice and ileocecal valve. The                            colonoscopy was performed without difficulty. The                            patient tolerated the procedure well. The quality                            of the bowel preparation was excellent. The                            ileocecal valve, appendiceal orifice, and rectum                            were photographed. The quality of the bowel                            preparation was evaluated using the BBPS [facility]                            Bowel Preparation Scale) with scores of: Right                            Colon = 3, Transverse Colon = 3 and Left Colon = 3                            (entire mucosa seen well with no residual staining,                            small fragments of stool or opaque liquid). The  total BBPS score equals 9. The bowel preparation                            used was SUPREP. Scope In: 3:22:48 PM Scope Out: 3:34:08 PM Scope Withdrawal Time: 0 hours 8 minutes 22 seconds  Total Procedure Duration: 0 hours 11 minutes 20 seconds  Findings:                 The perianal exam findings include non-thrombosed                            external hemorrhoids.                           Internal hemorrhoids were found during                            retroflexion. The hemorrhoids were small and Grade                            I (internal hemorrhoids that do not prolapse).                           The exam was otherwise without abnormality on                            direct and retroflexion views. Complications:            No immediate complications. Estimated Blood Loss:     Estimated blood loss: none. Impression:               - Non-thrombosed external hemorrhoids found on                            perianal exam.                           - Internal hemorrhoids.                            - The examination was otherwise normal on direct                            and retroflexion views.                           - No specimens collected. Recommendation:           - Patient has a contact number available for                            emergencies. The signs and symptoms of potential                            delayed complications were discussed with the                            patient. Return to normal activities tomorrow.  Written discharge instructions were provided to the                            patient.                           - Resume previous diet.                           - Continue present medications.                           - Repeat colonoscopy in 5 years for surveillance.                           - Return to my office as needed to consider                            hemorrhoidal banding if bleeding and other symptoms                            persist depite a bowel regimen to maintain                            regularity. Laya Letendre L. Loletha Carrow, MD 02/07/2017 3:47:57 PM This report has been signed electronically.

## 2017-02-07 NOTE — Progress Notes (Signed)
Spontaneous respirations throughout. VSS. Resting comfortably. To PACU on room air. Report to  Sara RN. 

## 2017-02-08 ENCOUNTER — Telehealth: Payer: Self-pay | Admitting: *Deleted

## 2017-02-08 NOTE — Telephone Encounter (Signed)
  Follow up Call-  Call back number 02/07/2017  Post procedure Call Back phone  # [phone]  Permission to leave phone message Yes  Some recent data might be hidden     Patient questions:  Do you have a fever, pain , or abdominal swelling? No. Pain Score  0 *  Have you tolerated food without any problems? Yes.    Have you been able to return to your normal activities? Yes.    Do you have any questions about your discharge instructions: Diet   No. Medications  No. Follow up visit  No.  Do you have questions or concerns about your Care? No.  Actions: * If pain score is 4 or above: No action needed, pain <4.

## 2017-02-13 ENCOUNTER — Other Ambulatory Visit: Payer: PRIVATE HEALTH INSURANCE

## 2017-02-13 LAB — HM COLONOSCOPY

## 2017-07-20 ENCOUNTER — Ambulatory Visit: Payer: BLUE CROSS/BLUE SHIELD | Admitting: Gastroenterology

## 2017-12-06 ENCOUNTER — Encounter: Payer: BLUE CROSS/BLUE SHIELD | Admitting: Internal Medicine

## 2018-01-14 ENCOUNTER — Other Ambulatory Visit: Payer: Self-pay | Admitting: Cardiovascular Disease

## 2019-12-04 DIAGNOSIS — Z20822 Contact with and (suspected) exposure to covid-19: Secondary | ICD-10-CM | POA: Diagnosis not present

## 2019-12-10 ENCOUNTER — Telehealth: Payer: Self-pay | Admitting: Internal Medicine

## 2019-12-10 NOTE — Telephone Encounter (Signed)
    Are you willing to re-establish care with patient. Last seen 3 years ago.   [id number] [phone]

## 2020-01-07 ENCOUNTER — Ambulatory Visit: Payer: BC Managed Care – PPO | Admitting: Internal Medicine

## 2020-01-07 ENCOUNTER — Other Ambulatory Visit: Payer: Self-pay

## 2020-01-07 ENCOUNTER — Encounter: Payer: Self-pay | Admitting: Internal Medicine

## 2020-01-07 VITALS — BP 138/82 | HR 56 | Temp 99.0°F | Resp 16 | Ht 72.0 in | Wt 144.0 lb

## 2020-01-07 DIAGNOSIS — Z125 Encounter for screening for malignant neoplasm of prostate: Secondary | ICD-10-CM | POA: Diagnosis not present

## 2020-01-07 DIAGNOSIS — Z0000 Encounter for general adult medical examination without abnormal findings: Secondary | ICD-10-CM | POA: Diagnosis not present

## 2020-01-07 DIAGNOSIS — I2510 Atherosclerotic heart disease of native coronary artery without angina pectoris: Secondary | ICD-10-CM

## 2020-01-07 DIAGNOSIS — E7800 Pure hypercholesterolemia, unspecified: Secondary | ICD-10-CM

## 2020-01-07 DIAGNOSIS — Z23 Encounter for immunization: Secondary | ICD-10-CM

## 2020-01-07 LAB — HEPATIC FUNCTION PANEL
ALT: 12 U/L (ref 0–53)
AST: 23 U/L (ref 0–37)
Albumin: 4.3 g/dL (ref 3.5–5.2)
Alkaline Phosphatase: 74 U/L (ref 39–117)
Bilirubin, Direct: 0.1 mg/dL (ref 0.0–0.3)
Total Bilirubin: 0.7 mg/dL (ref 0.2–1.2)
Total Protein: 7.1 g/dL (ref 6.0–8.3)

## 2020-01-07 LAB — LIPID PANEL
Cholesterol: 182 mg/dL (ref 0–200)
HDL: 73.9 mg/dL (ref >39.00)
LDL Cholesterol: 92 mg/dL (ref 0–99)
NonHDL: 107.93
Total CHOL/HDL Ratio: 2
Triglycerides: 81 mg/dL (ref 0.0–149.0)
VLDL: 16.2 mg/dL (ref 0.0–40.0)

## 2020-01-07 LAB — CBC WITH DIFFERENTIAL/PLATELET
Basophils Absolute: 0 10*3/uL (ref 0.0–0.1)
Basophils Relative: 0.5 % (ref 0.0–3.0)
Eosinophils Absolute: 0.1 10*3/uL (ref 0.0–0.7)
Eosinophils Relative: 0.9 % (ref 0.0–5.0)
HCT: 43.4 % (ref 39.0–52.0)
Hemoglobin: 14.4 g/dL (ref 13.0–17.0)
Lymphocytes Relative: 37.7 % (ref 12.0–46.0)
Lymphs Abs: 2.4 10*3/uL (ref 0.7–4.0)
MCHC: 33.1 g/dL (ref 30.0–36.0)
MCV: 90.8 fl (ref 78.0–100.0)
Monocytes Absolute: 0.4 10*3/uL (ref 0.1–1.0)
Monocytes Relative: 7.1 % (ref 3.0–12.0)
Neutro Abs: 3.4 10*3/uL (ref 1.4–7.7)
Neutrophils Relative %: 53.8 % (ref 43.0–77.0)
Platelets: 258 10*3/uL (ref 150.0–400.0)
RBC: 4.78 Mil/uL (ref 4.22–5.81)
RDW: 15.5 % (ref 11.5–15.5)
WBC: 6.2 10*3/uL (ref 4.0–10.5)

## 2020-01-07 LAB — BASIC METABOLIC PANEL
BUN: 12 mg/dL (ref 6–23)
CO2: 34 mEq/L — ABNORMAL HIGH (ref 19–32)
Calcium: 9.5 mg/dL (ref 8.4–10.5)
Chloride: 99 mEq/L (ref 96–112)
Creatinine, Ser: 1.06 mg/dL (ref 0.40–1.50)
GFR: 89.72 mL/min (ref >60.00)
Glucose, Bld: 83 mg/dL (ref 70–99)
Potassium: 3.8 mEq/L (ref 3.5–5.1)
Sodium: 139 mEq/L (ref 135–145)

## 2020-01-07 LAB — PSA: PSA: 2.01 ng/mL (ref 0.10–4.00)

## 2020-01-07 LAB — TSH: TSH: 1.92 u[IU]/mL (ref 0.35–4.50)

## 2020-01-07 MED ORDER — ASPIRIN EC 81 MG PO TBEC: 81.0000 mg | DELAYED_RELEASE_TABLET | Freq: Every day | ORAL | 1 refills | Status: DC

## 2020-01-07 MED ORDER — ATORVASTATIN CALCIUM 40 MG PO TABS: 40.0000 mg | ORAL_TABLET | Freq: Every day | ORAL | 1 refills | Status: DC

## 2020-01-07 NOTE — Progress Notes (Signed)
Subjective:  Patient ID: Sean Morris, male    DOB: 08/11/1971  Age: 49 y.o. MRN: [id number]  CC: Annual Exam, Hyperlipidemia, and Hypertension  This visit occurred during the SARS-CoV-2 public health emergency.  Safety protocols were in place, including screening questions prior to the visit, additional usage of staff PPE, and extensive cleaning of exam room while observing appropriate contact time as indicated for disinfecting solutions.    HPI Sean Morris presents for a CPX.  He has felt well recently and offers no complaints.  He is active and denies any recent episodes of CP, DOE, palpitations, fatigue, dizziness, or lightheadedness.  He is tolerating the statin well with no muscle or joint aches.  Outpatient Medications Prior to Visit  Medication Sig Dispense Refill  . aspirin EC 81 MG tablet Take 1 tablet (81 mg total) by mouth daily. 90 tablet 3  . atorvastatin (LIPITOR) 40 MG tablet Take 1 tablet (40 mg total) by mouth daily. Patient overdue for labs and office visit,must call and schedule for further refills 1st attempt 30 tablet 0  . hydrocortisone-pramoxine (PROCTOFOAM HC) rectal foam Place 1 applicator rectally 2 (two) times daily. (Patient not taking: Reported on 02/07/2017) 10 g 1  . 0.9 %  sodium chloride infusion      No facility-administered medications prior to visit.    ROS Review of Systems  Constitutional: Negative for chills, diaphoresis, fatigue and fever.  HENT: Negative.   Eyes: Negative for visual disturbance.  Respiratory: Negative for cough, chest tightness, shortness of breath and wheezing.   Cardiovascular: Negative for chest pain, palpitations and leg swelling.  Gastrointestinal: Negative for abdominal pain, constipation, diarrhea, nausea and vomiting.  Endocrine: Negative.   Genitourinary: Negative.  Negative for difficulty urinating, scrotal swelling, testicular pain and urgency.  Musculoskeletal: Negative for arthralgias and myalgias.  Skin:  Negative.  Negative for color change, pallor and rash.  Neurological: Negative.  Negative for dizziness, weakness, light-headedness, numbness and headaches.  Hematological: Negative for adenopathy. Does not bruise/bleed easily.  Psychiatric/Behavioral: Negative.     Objective:  BP 138/82 (BP Location: Left Arm, Patient Position: Sitting, Cuff Size: Normal)   Pulse (!) 56   Temp 99 F (37.2 C) (Oral)   Resp 16   Ht 6' (1.829 m)   Wt 144 lb (65.3 kg)   SpO2 96%   BMI 19.53 kg/m   BP Readings from Last 3 Encounters:  01/07/20 138/82  02/07/17 117/74  01/11/17 110/80    Wt Readings from Last 3 Encounters:  01/07/20 144 lb (65.3 kg)  02/07/17 155 lb (70.3 kg)  01/11/17 155 lb 12.8 oz (70.7 kg)    Physical Exam Vitals reviewed.  Constitutional:      Appearance: Normal appearance.  HENT:     Nose: Nose normal.     Mouth/Throat:     Mouth: Mucous membranes are moist.  Eyes:     General: No scleral icterus.    Conjunctiva/sclera: Conjunctivae normal.  Cardiovascular:     Rate and Rhythm: Regular rhythm. Bradycardia present.     Pulses:          Carotid pulses are 1+ on the right side and 1+ on the left side.      Radial pulses are 1+ on the right side and 1+ on the left side.       Femoral pulses are 1+ on the right side and 1+ on the left side.      Popliteal pulses are 1+ on the  right side and 1+ on the left side.       Dorsalis pedis pulses are 1+ on the right side and 1+ on the left side.       Posterior tibial pulses are 1+ on the right side and 1+ on the left side.     Heart sounds: No murmur. No gallop.      Comments: EKG -   Sinus bradycardia, 56 bpm No LVH, Q waves, or ST/T wave changes Pulmonary:     Effort: Pulmonary effort is normal.     Breath sounds: No stridor. No wheezing, rhonchi or rales.  Abdominal:     General: Abdomen is flat. Bowel sounds are normal. There is no distension.     Palpations: Abdomen is soft. There is no hepatomegaly or mass.      Tenderness: There is no abdominal tenderness.     Hernia: There is no hernia in the left inguinal area or right inguinal area.  Genitourinary:    Pubic Area: No rash.      Penis: Normal and circumcised. No discharge, swelling or lesions.      Testes: Normal.     Epididymis:     Right: Normal. Not inflamed or enlarged. No mass.     Left: Normal. Not inflamed or enlarged. No mass.  Musculoskeletal:        General: Normal range of motion.     Cervical back: Neck supple.     Right lower leg: No edema.     Left lower leg: No edema.  Lymphadenopathy:     Cervical: No cervical adenopathy.     Lower Body: No right inguinal adenopathy. No left inguinal adenopathy.  Skin:    General: Skin is warm and dry.     Findings: No rash.  Neurological:     General: No focal deficit present.     Mental Status: He is alert.  Psychiatric:        Mood and Affect: Mood normal.        Behavior: Behavior normal.     Lab Results  Component Value Date   WBC 6.2 01/07/2020   HGB 14.4 01/07/2020   HCT 43.4 01/07/2020   PLT 258.0 01/07/2020   GLUCOSE 83 01/07/2020   CHOL 182 01/07/2020   TRIG 81.0 01/07/2020   HDL 73.90 01/07/2020   LDLCALC 92 01/07/2020   ALT 12 01/07/2020   AST 23 01/07/2020   NA 139 01/07/2020   K 3.8 01/07/2020   CL 99 01/07/2020   CREATININE 1.06 01/07/2020   BUN 12 01/07/2020   CO2 34 (H) 01/07/2020   TSH 1.92 01/07/2020   PSA 2.01 01/07/2020   INR 0.98 02/07/2011   HGBA1C  02/06/2011    5.5 (NOTE)                                                                       According to the ADA Clinical Practice Recommendations for 2011, when HbA1c is used as a screening test:   >=6.5%   Diagnostic of Diabetes Mellitus           (if abnormal result  is confirmed)  5.7-6.4%   Increased risk of developing Diabetes Mellitus  References:Diagnosis and Classification of Diabetes  [place],Diabetes [id number] 1):S62-S69 and Standards of Medical Care in         Diabetes -  2011,Diabetes [id number]  (Suppl 1):S11-S61.    DG Chest 2 View  Result Date: 06/10/2015 CLINICAL DATA:  One week of chest pain, history of stent placement, nonsmoker. EXAM: CHEST  2 VIEW COMPARISON:  PA and lateral chest x-ray of February 16, 2011 FINDINGS: The lungs are adequately inflated and clear. The heart and pulmonary vascularity are normal. A cardiac stent is visible on the lateral view The mediastinum is normal in width. There is no pleural effusion. The bony thorax is unremarkable. IMPRESSION: There is no active cardiopulmonary disease. Electronically Signed   By: David  Martinique M.D.   On: 06/10/2015 14:56    Assessment & Plan:   Beulah was seen today for annual exam, hyperlipidemia and hypertension.  Diagnoses and all orders for this visit:  Coronary artery disease involving native coronary artery of native heart without angina pectoris- He is free of angina.  His EKG is negative for infarct or ischemia.  Will continue aggressive risk factor modifications. -     CBC with Differential/Platelet -     Basic metabolic panel -     EKG [id number] -     atorvastatin (LIPITOR) 40 MG tablet; Take 1 tablet (40 mg total) by mouth daily. -     aspirin EC 81 MG tablet; Take 1 tablet (81 mg total) by mouth daily.  Pure hypercholesterolemia- He has achieved his LDL goal and is doing well on the statin. -     CBC with Differential/Platelet -     Basic metabolic panel -     Hepatic function panel -     TSH -     atorvastatin (LIPITOR) 40 MG tablet; Take 1 tablet (40 mg total) by mouth daily.  Routine general medical examination at a health care facility- Exam completed, labs reviewed, vaccines reviewed and updated, colon cancer screening is up-to-date, patient education was given. -     Lipid panel -     PSA -     HIV Antibody (routine testing w rflx)  Need for Tdap vaccination -     Tdap vaccine greater than or equal to 7yo IM   I have discontinued Oswald R. Lua's  hydrocortisone-pramoxine. I have also changed his atorvastatin. Additionally, I am having him maintain his aspirin EC. We will stop administering sodium chloride.  Meds ordered this encounter  Medications  . atorvastatin (LIPITOR) 40 MG tablet    Sig: Take 1 tablet (40 mg total) by mouth daily.    Dispense:  90 tablet    Refill:  1  . aspirin EC 81 MG tablet    Sig: Take 1 tablet (81 mg total) by mouth daily.    Dispense:  90 tablet    Refill:  1     Follow-up: Return in about 6 months (around 07/09/2020).  Scarlette Calico, MD

## 2020-01-07 NOTE — Patient Instructions (Signed)

## 2020-01-08 ENCOUNTER — Encounter: Payer: Self-pay | Admitting: Internal Medicine

## 2020-01-08 LAB — HIV ANTIBODY (ROUTINE TESTING W REFLEX): HIV 1&2 Ab, 4th Generation: NONREACTIVE

## 2020-01-30 NOTE — Progress Notes (Signed)
Chief Complaint  Patient presents with  . Follow-up    CAD    History of Present Illness: 49 yo male with history of tobacco abuse and CAD here today for cardiac follow up. He was admitted to [facility] 02/07/11 with unstable angina and was found to have severe mid LAD stenosis. A 3.5 x 15 mm Vision bare metal stent was placed in the mid LAD. The other vessels were free of disease. LV function was normal. Echo August 2016 with normal LV function. Exercise nuclear stress test August 2016 with no ischemia. He has had a rash with Pravastatin but has tolerated Lipitor. He was last seen in our office in January 2018.   He is here today for follow up. The patient denies any chest pain, dyspnea, palpitations, lower extremity edema, orthopnea, PND, dizziness, near syncope or syncope.   Primary Care Physician: Janith Lima, MD  Past Medical History:  Diagnosis Date  . CAD (coronary artery disease)    s/p 3.5 x 15 mm bare metal stent mid LAD April 2012  . GERD (gastroesophageal reflux disease)   . Hemorrhoids   . History of cardiovascular stress test    ETT-Myoview 8/16: No ischemia or scar, EF 51%; Low Risk  . History of echocardiogram    Echo 6/14: Mild LVH, EF 55-65%, normal wall motion  . History of echocardiogram    Echo 8/16:  EF [id number], normal diastolic function, no RWMA  . Hyperlipidemia   . Plantar fasciitis     Past Surgical History:  Procedure Laterality Date  . CORONARY STENT PLACEMENT    . extraction of wisdom teeth    . Plantar fasciitis surgery      Current Outpatient Medications  Medication Sig Dispense Refill  . aspirin EC 81 MG tablet Take 1 tablet (81 mg total) by mouth daily. 90 tablet 1  . atorvastatin (LIPITOR) 40 MG tablet Take 1 tablet (40 mg total) by mouth daily. 90 tablet 1  . Biotin 1000 MCG tablet Take 1,000 mcg by mouth daily.    . Multiple Vitamin (MULTIVITAMIN) tablet Take 1 tablet by mouth daily.    Marland Kitchen VITAMIN D PO Take 25 mg by mouth  daily.     No current facility-administered medications for this visit.    Allergies  Allergen Reactions  . Crestor [Rosuvastatin Calcium] Rash  . Pravastatin Itching and Rash    Social History   Socioeconomic History  . Marital status: Married    Spouse name: Not on file  . Number of children: Not on file  . Years of education: Not on file  . Highest education level: Not on file  Occupational History  . Occupation: Transport planner     Comment: [employer]  . Smoking status: Current Every Day Smoker    Packs/day: 0.50    Years: 20.00    Pack years: 10.00  . Smokeless tobacco: Never Used  . Tobacco comment: pt stopped two days ago  Substance and Sexual Activity  . Alcohol use: No    Comment: 1-2 drinks daily   . Drug use: Yes    Types: Marijuana  . Sexual activity: Yes  Other Topics Concern  . Not on file  Social History Narrative   Daily caffeine    Social Determinants of Health   Financial Resource Strain:   . Difficulty of Paying Living Expenses:   Food Insecurity:   . Worried About Charity fundraiser in the  Last Year:   . [place] in the Last Year:   Transportation Needs:   . Film/video editor (Medical):   Marland Kitchen Lack of Transportation (Non-Medical):   Physical Activity:   . Days of Exercise per Week:   . Minutes of Exercise per Session:   Stress:   . Feeling of Stress :   Social Connections:   . Frequency of Communication with Friends and Family:   . Frequency of Social Gatherings with Friends and Family:   . Attends Religious Services:   . Active Member of Clubs or Organizations:   . Attends Archivist Meetings:   Marland Kitchen Marital Status:   Intimate Partner Violence:   . Fear of Current or Ex-Partner:   . Emotionally Abused:   Marland Kitchen Physically Abused:   . Sexually Abused:     Family History  Problem Relation Age of Onset  . Diabetes Mother   . Hypertension Mother   . Arthritis Mother   . Colon polyps  Mother   . Cancer Father 37       prostate  . Heart attack Paternal Grandfather   . Alcohol abuse Neg Hx   . Stroke Neg Hx   . Heart disease Neg Hx   . Hyperlipidemia Neg Hx   . Kidney disease Neg Hx   . Colon cancer Neg Hx   . Esophageal cancer Neg Hx   . Rectal cancer Neg Hx   . Stomach cancer Neg Hx     Review of Systems:  As stated in the HPI and otherwise negative.   BP 120/78   Pulse 62   Ht 6' (1.829 m)   Wt 147 lb (66.7 kg)   SpO2 99%   BMI 19.94 kg/m   Physical Examination: General: Well developed, well nourished, NAD  HEENT: OP clear, mucus membranes moist  SKIN: warm, dry. No rashes. Neuro: No focal deficits  Musculoskeletal: Muscle strength 5/5 all ext  Psychiatric: Mood and affect normal  Neck: No JVD, no carotid bruits, no thyromegaly, no lymphadenopathy.  Lungs:Clear bilaterally, no wheezes, rhonci, crackles Cardiovascular: Regular rate and rhythm. No murmurs, gallops or rubs. Abdomen:Soft. Bowel sounds present. Non-tender.  Extremities: No lower extremity edema. Pulses are 2 + in the bilateral DP/PT.  Echo August 2016: Left ventricle: The cavity size was normal. Wall thickness was normal. Systolic function was normal. The estimated ejection fraction was in the range of 50% to 55%. Wall motion was normal; there were no regional wall motion abnormalities. Left ventricular diastolic function parameters were normal. Impressions: - Normal LV systolic and diastolic function; trace MR and TR  EKG:  EKG is not ordered today. The ekg ordered today demonstrates  EKG from March 2021 in primary care reviewed by me and shows sinus brady  Recent Labs: 01/07/2020: ALT 12; BUN 12; Creatinine, Ser 1.06; Hemoglobin 14.4; Platelets 258.0; Potassium 3.8; Sodium 139; TSH 1.92   Lipid Panel    Component Value Date/Time   CHOL 182 01/07/2020 1415   TRIG 81.0 01/07/2020 1415   HDL 73.90 01/07/2020 1415   CHOLHDL 2 01/07/2020 1415   VLDL 16.2 01/07/2020 1415     LDLCALC 92 01/07/2020 1415     Wt Readings from Last 3 Encounters:  01/31/20 147 lb (66.7 kg)  01/07/20 144 lb (65.3 kg)  02/07/17 155 lb (70.3 kg)     Other studies Reviewed: Additional studies/ records that were reviewed today include: . Review of the above records demonstrates:    Assessment  and Plan:   1. CAD without angina: No chest pain. He has not been on a beta blocker secondary to bradycardia. Continue ASA and statin.   2. HLD: LDL near goal several weeks ago. Continue statin.   Current medicines are reviewed at length with the patient today.  The patient does not have concerns regarding medicines.  The following changes have been made:  no change  Labs/ tests ordered today include:   No orders of the defined types were placed in this encounter.   Disposition:   FU with me in 12  months  Signed, Lauree Chandler, MD 01/31/2020 10:21 AM    [facility] [address], [place], [place]  [address] Phone: [phone]; Fax: [phone]

## 2020-01-31 ENCOUNTER — Encounter: Payer: Self-pay | Admitting: Cardiovascular Disease

## 2020-01-31 ENCOUNTER — Other Ambulatory Visit: Payer: Self-pay

## 2020-01-31 ENCOUNTER — Encounter: Payer: Self-pay | Admitting: Nurse Practitioner

## 2020-01-31 ENCOUNTER — Ambulatory Visit: Payer: BC Managed Care – PPO | Admitting: Cardiovascular Disease

## 2020-01-31 VITALS — BP 120/78 | HR 62 | Ht 72.0 in | Wt 147.0 lb

## 2020-01-31 DIAGNOSIS — E7800 Pure hypercholesterolemia, unspecified: Secondary | ICD-10-CM

## 2020-01-31 DIAGNOSIS — I2510 Atherosclerotic heart disease of native coronary artery without angina pectoris: Secondary | ICD-10-CM

## 2020-01-31 NOTE — Patient Instructions (Signed)
Medication Instructions:  Your physician recommends that you continue on your current medications as directed. Please refer to the Current Medication list given to you today.  *If you need a refill on your cardiac medications before your next appointment, please call your pharmacy*   Lab Work: None Ordered If you have labs (blood work) drawn today and your tests are completely normal, you will receive your results only by: MyChart Message (if you have MyChart) OR A paper copy in the mail If you have any lab test that is abnormal or we need to change your treatment, we will call you to review the results.   Testing/Procedures: None Ordered   Follow-Up: At [facility], you and your health needs are our priority.  As part of our continuing mission to provide you with exceptional heart care, we have created designated Provider Care Teams.  These Care Teams include your primary Cardiologist (physician) and Advanced Practice Providers (APPs -  Physician Assistants and Nurse Practitioners) who all work together to provide you with the care you need, when you need it.  We recommend signing up for the patient portal called "MyChart".  Sign up information is provided on this After Visit Summary.  MyChart is used to connect with patients for Virtual Visits (Telemedicine).  Patients are able to view lab/test results, encounter notes, upcoming appointments, etc.  Non-urgent messages can be sent to your provider as well.   To learn more about what you can do with MyChart, go to [email or web address].    Your next appointment:   1 year(s)  The format for your next appointment:   In Person  Provider:   You may see Christopher McAlhany, MD or one of the following Advanced Practice Providers on your designated Care Team:   Dayna Dunn, PA-C Michele Lenze, PA-C   

## 2020-02-20 ENCOUNTER — Telehealth: Payer: Self-pay | Admitting: Internal Medicine

## 2020-02-20 NOTE — Telephone Encounter (Signed)
New message:   Pt is calling and states he has some concerns about his lab results and would like to go over them. Please advise.

## 2020-02-21 NOTE — Telephone Encounter (Signed)
Contacted pt and he was concerned with the PSA result and wanted to know if there is anything that he should do or not do.   Per PCP - we will recheck the PSA in 6 months. 7 days prior to visit pt should not have intercourse or masturbate. Pt stated understanding and 6 month follow up has been scheduled.

## 2020-07-16 ENCOUNTER — Other Ambulatory Visit: Payer: Self-pay | Admitting: Internal Medicine

## 2020-07-16 DIAGNOSIS — E7800 Pure hypercholesterolemia, unspecified: Secondary | ICD-10-CM

## 2020-07-16 DIAGNOSIS — I2510 Atherosclerotic heart disease of native coronary artery without angina pectoris: Secondary | ICD-10-CM

## 2020-07-23 ENCOUNTER — Ambulatory Visit: Payer: BC Managed Care – PPO | Admitting: Internal Medicine

## 2020-07-29 ENCOUNTER — Other Ambulatory Visit: Payer: Self-pay

## 2020-07-29 ENCOUNTER — Ambulatory Visit: Payer: BC Managed Care – PPO | Admitting: Internal Medicine

## 2020-07-29 ENCOUNTER — Encounter: Payer: Self-pay | Admitting: Internal Medicine

## 2020-07-29 VITALS — BP 130/70 | HR 61 | Temp 98.7°F | Resp 16 | Ht 72.0 in | Wt 146.0 lb

## 2020-07-29 DIAGNOSIS — L280 Lichen simplex chronicus: Secondary | ICD-10-CM

## 2020-07-29 DIAGNOSIS — B3789 Other sites of candidiasis: Secondary | ICD-10-CM | POA: Diagnosis not present

## 2020-07-29 DIAGNOSIS — R972 Elevated prostate specific antigen [PSA]: Secondary | ICD-10-CM | POA: Diagnosis not present

## 2020-07-29 DIAGNOSIS — Z1159 Encounter for screening for other viral diseases: Secondary | ICD-10-CM | POA: Diagnosis not present

## 2020-07-29 LAB — PSA: PSA: 1.63

## 2020-07-29 MED ORDER — FLUCONAZOLE 100 MG PO TABS: 100.0000 mg | ORAL_TABLET | Freq: Every day | ORAL | 0 refills | Status: AC

## 2020-07-29 MED ORDER — FLUOCINONIDE 0.05 % EX CREA: 1.0000 "application " | TOPICAL_CREAM | Freq: Two times a day (BID) | CUTANEOUS | 1 refills | Status: DC

## 2020-07-29 NOTE — Progress Notes (Signed)
Subjective:  Patient ID: JAICOB DIA, male    DOB: [birth year]  Age: 49 y.o. MRN: [id number]  CC: Rash  This visit occurred during the SARS-CoV-2 public health emergency.  Safety protocols were in place, including screening questions prior to the visit, additional usage of staff PPE, and extensive cleaning of exam room while observing appropriate contact time as indicated for disinfecting solutions.    HPI Sean Morris presents for f/up - He complains of a 6-week history of pruritic rash on his buttocks.  He has been using wet wipes and Neosporin without any improvement in the symptoms.  Outpatient Medications Prior to Visit  Medication Sig Dispense Refill  . aspirin EC 81 MG tablet Take 1 tablet (81 mg total) by mouth daily. 90 tablet 1  . atorvastatin (LIPITOR) 40 MG tablet TAKE 1 TABLET(40 MG) BY MOUTH DAILY 90 tablet 0  . Biotin 1000 MCG tablet Take 1,000 mcg by mouth daily.    . Multiple Vitamin (MULTIVITAMIN) tablet Take 1 tablet by mouth daily.    Marland Kitchen VITAMIN D PO Take 25 mg by mouth daily.     No facility-administered medications prior to visit.    ROS Review of Systems  Constitutional: Negative.  Negative for chills, fatigue and fever.  HENT: Negative.  Negative for sore throat and trouble swallowing.   Eyes: Negative for visual disturbance.  Respiratory: Negative for cough, chest tightness, shortness of breath and wheezing.   Cardiovascular: Negative for chest pain, palpitations and leg swelling.  Gastrointestinal: Negative for abdominal pain, constipation, diarrhea, nausea and vomiting.  Genitourinary: Negative.  Negative for difficulty urinating.  Musculoskeletal: Negative for arthralgias and myalgias.  Skin: Positive for rash.  Neurological: Negative.  Negative for dizziness, weakness and light-headedness.  Hematological: Negative for adenopathy. Does not bruise/bleed easily.  Psychiatric/Behavioral: Negative.     Objective:  BP 130/70 (BP Location: Left Arm,  Patient Position: Sitting, Cuff Size: Large)   Pulse 61   Temp 98.7 F (37.1 C) (Oral)   Resp 16   Ht 6' (1.829 m)   Wt 146 lb (66.2 kg)   SpO2 98%   BMI 19.80 kg/m   BP Readings from Last 3 Encounters:  07/29/20 130/70  01/31/20 120/78  01/07/20 138/82    Wt Readings from Last 3 Encounters:  07/29/20 146 lb (66.2 kg)  01/31/20 147 lb (66.7 kg)  01/07/20 144 lb (65.3 kg)    Physical Exam Vitals reviewed.  Constitutional:      Appearance: Normal appearance.  HENT:     Nose: Nose normal.     Mouth/Throat:     Mouth: Mucous membranes are moist.  Eyes:     General: No scleral icterus.    Conjunctiva/sclera: Conjunctivae normal.  Cardiovascular:     Rate and Rhythm: Normal rate and regular rhythm.     Heart sounds: No murmur heard.   Pulmonary:     Effort: Pulmonary effort is normal.     Breath sounds: No stridor. No wheezing, rhonchi or rales.  Abdominal:     General: Abdomen is flat.     Palpations: There is no mass.     Tenderness: There is no abdominal tenderness. There is no guarding.  Genitourinary:   Musculoskeletal:        General: Normal range of motion.     Cervical back: Neck supple.     Right lower leg: No edema.     Left lower leg: No edema.  Lymphadenopathy:     Cervical:  No cervical adenopathy.  Skin:    General: Skin is warm.     Findings: Rash present.  Neurological:     General: No focal deficit present.     Mental Status: He is alert.  Psychiatric:        Mood and Affect: Mood normal.        Behavior: Behavior normal.     Lab Results  Component Value Date   WBC 6.2 01/07/2020   HGB 14.4 01/07/2020   HCT 43.4 01/07/2020   PLT 258.0 01/07/2020   GLUCOSE 83 01/07/2020   CHOL 182 01/07/2020   TRIG 81.0 01/07/2020   HDL 73.90 01/07/2020   LDLCALC 92 01/07/2020   ALT 12 01/07/2020   AST 23 01/07/2020   NA 139 01/07/2020   K 3.8 01/07/2020   CL 99 01/07/2020   CREATININE 1.06 01/07/2020   BUN 12 01/07/2020   CO2 34 (H)  01/07/2020   TSH 1.92 01/07/2020   PSA 1.63 07/29/2020   INR 0.98 02/07/2011   HGBA1C  02/06/2011    5.5 (NOTE)                                                                       According to the ADA Clinical Practice Recommendations for 2011, when HbA1c is used as a screening test:   >=6.5%   Diagnostic of Diabetes Mellitus           (if abnormal result  is confirmed)  5.7-6.4%   Increased risk of developing Diabetes Mellitus  References:Diagnosis and Classification of Diabetes Mellitus,Diabetes [id number],7106,26(RSWNI 1):S62-S69 and Standards of Medical Care in         Diabetes - 2011,Diabetes Care,2011,34  (Suppl 1):S11-S61.    DG Chest 2 View  Result Date: 06/10/2015 CLINICAL DATA:  One week of chest pain, history of stent placement, nonsmoker. EXAM: CHEST  2 VIEW COMPARISON:  PA and lateral chest x-ray of February 16, 2011 FINDINGS: The lungs are adequately inflated and clear. The heart and pulmonary vascularity are normal. A cardiac stent is visible on the lateral view The mediastinum is normal in width. There is no pleural effusion. The bony thorax is unremarkable. IMPRESSION: There is no active cardiopulmonary disease. Electronically Signed   By: David  Martinique M.D.   On: 06/10/2015 14:56    Assessment & Plan:   Luccas was seen today for rash.  Diagnoses and all orders for this visit:  Need for hepatitis C screening test -     Hepatitis C antibody; Future -     Hepatitis C antibody  Rising PSA level - His PSA is down to 1.63. This is a reassuring sign that he does not have prostate ca. -     PSA; Future -     PSA  LSC (lichen simplex chronicus)- I asked him to stop using all the current topicals and to use a topical steroid. -     fluocinonide cream (LIDEX) 0.05 %; Apply 1 application topically 2 (two) times daily.  Candida rash of groin -     fluconazole (DIFLUCAN) 100 MG tablet; Take 1 tablet (100 mg total) by mouth daily for 10 days.   I am having [name] start on  fluocinonide cream and  fluconazole. I am also having him maintain his aspirin EC, multivitamin, VITAMIN D PO, Biotin, and atorvastatin.  Meds ordered this encounter  Medications  . fluocinonide cream (LIDEX) 0.05 %    Sig: Apply 1 application topically 2 (two) times daily.    Dispense:  60 g    Refill:  1  . fluconazole (DIFLUCAN) 100 MG tablet    Sig: Take 1 tablet (100 mg total) by mouth daily for 10 days.    Dispense:  10 tablet    Refill:  0     Follow-up: Return in about 3 months (around 10/28/2020).  Scarlette Calico, MD

## 2020-07-29 NOTE — Patient Instructions (Signed)
Contact Dermatitis Dermatitis is redness, soreness, and swelling (inflammation) of the skin. Contact dermatitis is a reaction to certain substances that touch the skin. Many different substances can cause contact dermatitis. There are two types of contact dermatitis:  Irritant contact dermatitis. This type is caused by something that irritates your skin, such as having dry hands from washing them too often with soap. This type does not require previous exposure to the substance for a reaction to occur. This is the most common type.  Allergic contact dermatitis. This type is caused by a substance that you are allergic to, such as poison ivy. This type occurs when you have been exposed to the substance (allergen) and develop a sensitivity to it. Dermatitis may develop soon after your first exposure to the allergen, or it may not develop until the next time you are exposed and every time thereafter. What are the causes? Irritant contact dermatitis is most commonly caused by exposure to:  Makeup.  Soaps.  Detergents.  Bleaches.  Acids.  Metal salts, such as nickel. Allergic contact dermatitis is most commonly caused by exposure to:  Poisonous plants.  Chemicals.  Jewelry.  Latex.  Medicines.  Preservatives in products, such as clothing. What increases the risk? You are more likely to develop this condition if you have:  A job that exposes you to irritants or allergens.  Certain medical conditions, such as asthma or eczema. What are the signs or symptoms? Symptoms of this condition may occur on your body anywhere the irritant has touched you or is touched by you.  Symptoms include: ? Dryness or flaking. ? Redness. ? Cracks. ? Itching. ? Pain or a burning feeling. ? Blisters. ? Drainage of small amounts of blood or clear fluid from skin cracks. With allergic contact dermatitis, there may also be swelling in areas such as the eyelids, mouth, or genitals. How is this  diagnosed? This condition is diagnosed with a medical history and physical exam.  A patch skin test may be performed to help determine the cause.  If the condition is related to your job, you may need to see an occupational medicine specialist. How is this treated? This condition is treated by checking for the cause of the reaction and protecting your skin from further contact. Treatment may also include:  Steroid creams or ointments. Oral steroid medicines may be needed in more severe cases.  Antibiotic medicines or antibacterial ointments, if a skin infection is present.  Antihistamine lotion or an antihistamine taken by mouth to ease itching.  A bandage (dressing). Follow these instructions at home: Skin care  Moisturize your skin as needed.  Apply cool compresses to the affected areas.  Try applying baking soda paste to your skin. Stir water into baking soda until it reaches a paste-like consistency.  Do not scratch your skin, and avoid friction to the affected area.  Avoid the use of soaps, perfumes, and dyes. Medicines  Take or apply over-the-counter and prescription medicines only as told by your health care provider.  If you were prescribed an antibiotic medicine, take or apply the antibiotic as told by your health care provider. Do not stop using the antibiotic even if your condition improves. Bathing  Try taking a bath with: ? Epsom salts. Follow the instructions on the packaging. You can get these at your local pharmacy or grocery store. ? Baking soda. Pour a small amount into the bath as directed by your health care provider. ? Colloidal oatmeal. Follow the instructions on the   packaging. You can get this at your local pharmacy or grocery store.  Bathe less frequently, such as every other day.  Bathe in lukewarm water. Avoid using hot water. Bandage care  If you were given a bandage (dressing), change it as told by your health care provider.  Wash your hands  with soap and water before and after you change your dressing. If soap and water are not available, use hand sanitizer. General instructions  Avoid the substance that caused your reaction. If you do not know what caused it, keep a journal to try to track what caused it. Write down: ? What you eat. ? What cosmetic products you use. ? What you drink. ? What you wear in the affected area. This includes jewelry.  Check the affected areas every day for signs of infection. Check for: ? More redness, swelling, or pain. ? More fluid or blood. ? Warmth. ? Pus or a bad smell.  Keep all follow-up visits as told by your health care provider. This is important. Contact a health care provider if:  Your condition does not improve with treatment.  Your condition gets worse.  You have signs of infection such as swelling, tenderness, redness, soreness, or warmth in the affected area.  You have a fever.  You have new symptoms. Get help right away if:  You have a severe headache, neck pain, or neck stiffness.  You vomit.  You feel very sleepy.  You notice red streaks coming from the affected area.  Your bone or joint underneath the affected area becomes painful after the skin has healed.  The affected area turns darker.  You have difficulty breathing. Summary  Dermatitis is redness, soreness, and swelling (inflammation) of the skin. Contact dermatitis is a reaction to certain substances that touch the skin.  Symptoms of this condition may occur on your body anywhere the irritant has touched you or is touched by you.  This condition is treated by figuring out what caused the reaction and protecting your skin from further contact. Treatment may also include medicines and skin care.  Avoid the substance that caused your reaction. If you do not know what caused it, keep a journal to try to track what caused it.  Contact a health care provider if your condition gets worse or you have signs  of infection such as swelling, tenderness, redness, soreness, or warmth in the affected area. This information is not intended to replace advice given to you by your health care provider. Make sure you discuss any questions you have with your health care provider. Document Revised: 02/06/2019 Document Reviewed: 05/02/2018 Elsevier Patient Education  2020 [employer]  

## 2020-07-30 LAB — PSA: PSA: 1.63 ng/mL (ref <=4.0)

## 2020-07-30 LAB — HEPATITIS C ANTIBODY
Hepatitis C Ab: NONREACTIVE
SIGNAL TO CUT-OFF: 0.11 (ref <1.00)

## 2020-10-22 ENCOUNTER — Other Ambulatory Visit: Payer: Self-pay | Admitting: Internal Medicine

## 2020-10-22 DIAGNOSIS — E7800 Pure hypercholesterolemia, unspecified: Secondary | ICD-10-CM

## 2020-10-22 DIAGNOSIS — I2510 Atherosclerotic heart disease of native coronary artery without angina pectoris: Secondary | ICD-10-CM

## 2020-11-05 DIAGNOSIS — F330 Major depressive disorder, recurrent, mild: Secondary | ICD-10-CM | POA: Diagnosis not present

## 2020-11-05 DIAGNOSIS — F411 Generalized anxiety disorder: Secondary | ICD-10-CM | POA: Diagnosis not present

## 2020-11-11 DIAGNOSIS — F411 Generalized anxiety disorder: Secondary | ICD-10-CM | POA: Diagnosis not present

## 2020-11-11 DIAGNOSIS — F330 Major depressive disorder, recurrent, mild: Secondary | ICD-10-CM | POA: Diagnosis not present

## 2020-11-18 DIAGNOSIS — F330 Major depressive disorder, recurrent, mild: Secondary | ICD-10-CM | POA: Diagnosis not present

## 2020-11-18 DIAGNOSIS — F411 Generalized anxiety disorder: Secondary | ICD-10-CM | POA: Diagnosis not present

## 2020-11-25 DIAGNOSIS — F330 Major depressive disorder, recurrent, mild: Secondary | ICD-10-CM | POA: Diagnosis not present

## 2020-11-25 DIAGNOSIS — F411 Generalized anxiety disorder: Secondary | ICD-10-CM | POA: Diagnosis not present

## 2020-12-02 DIAGNOSIS — F411 Generalized anxiety disorder: Secondary | ICD-10-CM | POA: Diagnosis not present

## 2020-12-02 DIAGNOSIS — F330 Major depressive disorder, recurrent, mild: Secondary | ICD-10-CM | POA: Diagnosis not present

## 2020-12-09 DIAGNOSIS — F411 Generalized anxiety disorder: Secondary | ICD-10-CM | POA: Diagnosis not present

## 2020-12-09 DIAGNOSIS — F330 Major depressive disorder, recurrent, mild: Secondary | ICD-10-CM | POA: Diagnosis not present

## 2020-12-16 DIAGNOSIS — F330 Major depressive disorder, recurrent, mild: Secondary | ICD-10-CM | POA: Diagnosis not present

## 2020-12-16 DIAGNOSIS — F411 Generalized anxiety disorder: Secondary | ICD-10-CM | POA: Diagnosis not present

## 2020-12-23 DIAGNOSIS — F330 Major depressive disorder, recurrent, mild: Secondary | ICD-10-CM | POA: Diagnosis not present

## 2020-12-23 DIAGNOSIS — F411 Generalized anxiety disorder: Secondary | ICD-10-CM | POA: Diagnosis not present

## 2020-12-30 DIAGNOSIS — F411 Generalized anxiety disorder: Secondary | ICD-10-CM | POA: Diagnosis not present

## 2020-12-30 DIAGNOSIS — F330 Major depressive disorder, recurrent, mild: Secondary | ICD-10-CM | POA: Diagnosis not present

## 2021-01-06 DIAGNOSIS — F411 Generalized anxiety disorder: Secondary | ICD-10-CM | POA: Diagnosis not present

## 2021-01-06 DIAGNOSIS — F330 Major depressive disorder, recurrent, mild: Secondary | ICD-10-CM | POA: Diagnosis not present

## 2021-01-13 DIAGNOSIS — F411 Generalized anxiety disorder: Secondary | ICD-10-CM | POA: Diagnosis not present

## 2021-01-13 DIAGNOSIS — F330 Major depressive disorder, recurrent, mild: Secondary | ICD-10-CM | POA: Diagnosis not present

## 2021-01-21 DIAGNOSIS — F330 Major depressive disorder, recurrent, mild: Secondary | ICD-10-CM | POA: Diagnosis not present

## 2021-01-21 DIAGNOSIS — F411 Generalized anxiety disorder: Secondary | ICD-10-CM | POA: Diagnosis not present

## 2021-01-24 ENCOUNTER — Other Ambulatory Visit: Payer: Self-pay | Admitting: Internal Medicine

## 2021-01-24 DIAGNOSIS — E7800 Pure hypercholesterolemia, unspecified: Secondary | ICD-10-CM

## 2021-01-24 DIAGNOSIS — I2510 Atherosclerotic heart disease of native coronary artery without angina pectoris: Secondary | ICD-10-CM

## 2021-01-28 DIAGNOSIS — F411 Generalized anxiety disorder: Secondary | ICD-10-CM | POA: Diagnosis not present

## 2021-01-28 DIAGNOSIS — F330 Major depressive disorder, recurrent, mild: Secondary | ICD-10-CM | POA: Diagnosis not present

## 2021-02-09 ENCOUNTER — Other Ambulatory Visit: Payer: Self-pay | Admitting: Internal Medicine

## 2021-02-09 DIAGNOSIS — I2510 Atherosclerotic heart disease of native coronary artery without angina pectoris: Secondary | ICD-10-CM

## 2021-02-09 DIAGNOSIS — E7800 Pure hypercholesterolemia, unspecified: Secondary | ICD-10-CM

## 2021-06-06 NOTE — Progress Notes (Signed)
Cardiology Office Note:    Date:  06/06/2021   ID:  Sean Morris, DOB 07/12/1971, MRN [id number]  PCP:  Janith Lima, MD   [facility] Providers Cardiologist:  Lauree Chandler, MD      Referring MD: Janith Lima, MD   Follow-up for coronary artery disease and hypercholesterolemia  History of Present Illness:    Sean Morris is a 50 y.o. male with a hx of coronary artery disease hemorrhoids, LSC, pure hypercholesterolemia, tobacco abuse and Candida groin rash.  He was admitted to [facility] 4/12 with unstable angina and found to have severe mid LAD stenosis.  He was treated with PCI and BMS to his mid LAD.  No other CAD was noted.  His LVEF was normal.  His echocardiogram 8/16 showed normal LVEF.  He underwent nuclear stress test 8/16 which showed no ischemia.  He developed a rash with pravastatin but has tolerated Lipitor well.  He was seen by Dr. Angelena Form on 01/31/2020.  During that time he denied chest pain, dyspnea, palpitations, lower extremity swelling, orthopnea, PND, dizziness, presyncope and syncope.  Who presents to the clinic today for follow-up evaluation states he feels well.  He is from [place].  He reports occasional brief episodes of sharp chest discomfort.  These episodes happen randomly last for seconds and dissipate without intervention.  He reports that he could go 3 weeks between episodes.  He continues to smoke 1.5 packs/day.  He reports that he has cutting back with his smoking.  He also reports that he has not used crack cocaine since he was 50 years of age.  We reviewed his previous cardiac catheterization and lipid panel.  We discussed a goal of LDL 70 or less.  He expressed understanding.  I will repeat his fasting lipids and LFTs, given a salty 6 diet sheet, have him increase his physical activity as tolerated, and follow-up in 12 months.  Today he denies chest pain, shortness of breath, lower extremity edema, fatigue, palpitations,  melena, hematuria, hemoptysis, diaphoresis, weakness, presyncope, syncope, orthopnea, and PND.   Past Medical History:  Diagnosis Date   CAD (coronary artery disease)    s/p 3.5 x 15 mm bare metal stent mid LAD April 2012   GERD (gastroesophageal reflux disease)    Hemorrhoids    History of cardiovascular stress test    ETT-Myoview 8/16: No ischemia or scar, EF 51%; Low Risk   History of echocardiogram    Echo 6/14: Mild LVH, EF 55-65%, normal wall motion   History of echocardiogram    Echo 8/16:  EF [id number], normal diastolic function, no RWMA   Hyperlipidemia    Plantar fasciitis     Past Surgical History:  Procedure Laterality Date   CORONARY STENT PLACEMENT     extraction of wisdom teeth     Plantar fasciitis surgery      Current Medications: No outpatient medications have been marked as taking for the 06/08/21 encounter (Appointment) with Deberah Pelton, NP.     Allergies:   Crestor [rosuvastatin calcium] and Pravastatin   Social History   Socioeconomic History   Marital status: Married    Spouse name: Not on file   Number of children: Not on file   Years of education: Not on file   Highest education level: Not on file  Occupational History   Occupation: Transportation     Comment: [employer]   Tobacco Use   Smoking status: Every Day  Packs/day: 0.50    Years: 20.00    Pack years: 10.00    Types: Cigarettes   Smokeless tobacco: Never   Tobacco comments:    pt stopped two days ago  Substance and Sexual Activity   Alcohol use: No    Comment: 1-2 drinks daily    Drug use: Yes    Types: Marijuana   Sexual activity: Yes  Other Topics Concern   Not on file  Social History Narrative   Daily caffeine    Social Determinants of Health   Financial Resource Strain: Not on file  Food Insecurity: Not on file  Transportation Needs: Not on file  Physical Activity: Not on file  Stress: Not on file  Social Connections: Not on file     Family  History: The patient's family history includes Arthritis in his mother; Cancer (age of onset: 48) in his father; Colon polyps in his mother; Diabetes in his mother; Heart attack in his paternal grandfather; Hypertension in his mother. There is no history of Alcohol abuse, Stroke, Heart disease, Hyperlipidemia, Kidney disease, Colon cancer, Esophageal cancer, Rectal cancer, or Stomach cancer.  ROS:   Please see the history of present illness.     All other systems reviewed and are negative.   Risk Assessment/Calculations:           Physical Exam:    VS:  There were no vitals taken for this visit.    Wt Readings from Last 3 Encounters:  07/29/20 146 lb (66.2 kg)  01/31/20 147 lb (66.7 kg)  01/07/20 144 lb (65.3 kg)     GEN: Well nourished, well developed in no acute distress HEENT: Normal NECK: No JVD; No carotid bruits LYMPHATICS: No lymphadenopathy CARDIAC: RRR, no murmurs, rubs, gallops RESPIRATORY:  Clear to auscultation without rales, wheezing or rhonchi  ABDOMEN: Soft, non-tender, non-distended MUSCULOSKELETAL:  No edema; No deformity  SKIN: Warm and dry NEUROLOGIC:  Alert and oriented x 3 PSYCHIATRIC:  Normal affect    EKGs/Labs/Other Studies Reviewed:    The following studies were reviewed today:  Echocardiogram 06/25/2015 Study Conclusions   - Left ventricle: The cavity size was normal. Wall thickness was    normal. Systolic function was normal. The estimated ejection    fraction was in the range of 50% to 55%. Wall motion was normal;    there were no regional wall motion abnormalities. Left    ventricular diastolic function parameters were normal.   Impressions:   - Normal LV systolic and diastolic function; trace MR and TR.   Nuclear stress test 06/16/2015 The left ventricular ejection fraction is mildly decreased (45-54%). Nuclear stress EF: 51%. There was no ST segment deviation noted during stress. This is a low risk study.   Good exercise  tolerance with no ECG changes and normal perfusion images so no evidence for ischemia or infarction.  EF calculated to mildly low at 51%.  Overall low risk study.  Would consider echo to reassess LV function.  EKG:  EKG is  ordered today.  The ekg ordered today demonstrates sinus bradycardia rightward axis deviation 58 bpm  Recent Labs: No results found for requested labs within last 8760 hours.  Recent Lipid Panel    Component Value Date/Time   CHOL 182 01/07/2020 1415   TRIG 81.0 01/07/2020 1415   HDL 73.90 01/07/2020 1415   CHOLHDL 2 01/07/2020 1415   VLDL 16.2 01/07/2020 1415   LDLCALC 92 01/07/2020 1415    ASSESSMENT & PLAN  Coronary artery disease-denies chest pain today.  No recent episodes of arm neck back or chest discomfort.  Remains physically active.  Underwent cardiac catheterization with PCI's and BMS placement 4/12.  Nuclear stress test 8/16 showed no ischemia. Continue atorvastatin, aspirin Heart healthy low-sodium diet-salty 6 given Increase physical activity as tolerated  Hyperlipidemia-LDL 92 on 01/07/2020 Continue atorvastatin, aspirin Heart healthy low-sodium high-fiber diet Increase physical activity as tolerated Recommend starting ezetimibe if LDL greater than 70 on repeat labs. Repeat fasting lipids and LFTs  Tobacco abuse-continues to smoke 1/2 packs/day.  Reports this is down from more than 2 packs/day.  Smoking cessation encouraged Smoking cessation information given  Disposition: Follow-up with Dr. Angelena Form in 12 months.       Medication Adjustments/Labs and Tests Ordered: Current medicines are reviewed at length with the patient today.  Concerns regarding medicines are outlined above.  No orders of the defined types were placed in this encounter.  No orders of the defined types were placed in this encounter.   There are no Patient Instructions on file for this visit.   Signed, Deberah Pelton, NP  06/06/2021 7:20 PM      Notice: This  dictation was prepared with Dragon dictation along with smaller phrase technology. Any transcriptional errors that result from this process are unintentional and may not be corrected upon review.  I spent 13 minutes examining this patient, reviewing medications, and using patient centered shared decision making involving her cardiac care.  Prior to her visit I spent greater than 20 minutes reviewing her past medical history,  medications, and prior cardiac tests.

## 2021-06-08 ENCOUNTER — Other Ambulatory Visit: Payer: Self-pay

## 2021-06-08 ENCOUNTER — Encounter (HOSPITAL_BASED_OUTPATIENT_CLINIC_OR_DEPARTMENT_OTHER): Payer: Self-pay | Admitting: General Practice

## 2021-06-08 ENCOUNTER — Ambulatory Visit (HOSPITAL_BASED_OUTPATIENT_CLINIC_OR_DEPARTMENT_OTHER): Payer: BC Managed Care – PPO | Admitting: General Practice

## 2021-06-08 VITALS — BP 140/82 | HR 58 | Ht 72.0 in | Wt 148.0 lb

## 2021-06-08 DIAGNOSIS — I2510 Atherosclerotic heart disease of native coronary artery without angina pectoris: Secondary | ICD-10-CM

## 2021-06-08 DIAGNOSIS — E7800 Pure hypercholesterolemia, unspecified: Secondary | ICD-10-CM | POA: Diagnosis not present

## 2021-06-08 DIAGNOSIS — Z720 Tobacco use: Secondary | ICD-10-CM | POA: Diagnosis not present

## 2021-06-08 NOTE — Patient Instructions (Signed)
Medication Instructions:  Your physician recommends that you continue on your current medications as directed. Please refer to the Current Medication list given to you today.  *If you need a refill on your cardiac medications before your next appointment, please call your pharmacy*   Lab Work: Your physician recommends that you return for a FASTING lipid profile and hepatic function panel at your earliest convenience.  If you have labs (blood work) drawn today and your tests are completely normal, you will receive your results only by: [place] (if you have MyChart) OR A paper copy in the mail If you have any lab test that is abnormal or we need to change your treatment, we will call you to review the results.  Follow-Up: At [facility], you and your health needs are our priority.  As part of our continuing mission to provide you with exceptional heart care, we have created designated Provider Care Teams.  These Care Teams include your primary Cardiologist (physician) and Advanced Practice Providers (APPs -  Physician Assistants and Nurse Practitioners) who all work together to provide you with the care you need, when you need it.  We recommend signing up for the patient portal called "MyChart".  Sign up information is provided on this After Visit Summary.  MyChart is used to connect with patients for Virtual Visits (Telemedicine).  Patients are able to view lab/test results, encounter notes, upcoming appointments, etc.  Non-urgent messages can be sent to your provider as well.   To learn more about what you can do with MyChart, go to [email or web address].    Your next appointment:   1 year(s)  The format for your next appointment:   In Person  Provider:   You may see Lauree Chandler, MD or one of the following Advanced Practice Providers on your designated Care Team:   Melina Copa, PA-C Ermalinda Barrios, PA-C   Other Instructions   Coletta Memos, FNP has recommended  to increase your physical activity as tolerated and to please review the information below on a high fiber diet and smoking cessation.  Managing the Challenge of Quitting Smoking Quitting smoking is a physical and mental challenge. You will face cravings, withdrawal symptoms, and temptation. Before quitting, work with your health care provider to make a plan that can help you manage quitting. Preparation canhelp you quit and keep you from giving in. How to manage lifestyle changes Managing stress Stress can make you want to smoke, and wanting to smoke may cause stress. It is important to find ways to manage your stress. You might try some of the following: Practice relaxation techniques. Breathe slowly and deeply, in through your nose and out through your mouth. Listen to music. Soak in a bath or take a shower. Imagine a peaceful place or vacation. Get some support. Talk with family or friends about your stress. Join a support group. Talk with a counselor or therapist. Get some physical activity. Go for a walk, run, or bike ride. Play a favorite sport. Practice yoga.  Medicines Talk with your health care provider about medicines that might help you dealwith cravings and make quitting easier for you. Relationships Social situations can be difficult when you are quitting smoking. To manage this, you can: Avoid parties and other social situations where people might be smoking. Avoid alcohol. Leave right away if you have the urge to smoke. Explain to your family and friends that you are quitting smoking. Ask for support and let them know you might be a  bit grumpy. Plan activities where smoking is not an option. General instructions Be aware that many people gain weight after they quit smoking. However, not everyone does. To keep from gaining weight, have a plan in place before you quit and stick to the plan after you quit. Your plan should include: Having healthy snacks. When you have a  craving, it may help to: Eat popcorn, carrots, celery, or other cut vegetables. Chew sugar-free gum. Changing how you eat. Eat small portion sizes at meals. Eat 4-6 small meals throughout the day instead of 1-2 large meals a day. Be mindful when you eat. Do not watch television or do other things that might distract you as you eat. Exercising regularly. Make time to exercise each day. If you do not have time for a long workout, do short bouts of exercise for 5-10 minutes several times a day. Do some form of strengthening exercise, such as weight lifting. Do some exercise that gets your heart beating and causes you to breathe deeply, such as walking fast, running, swimming, or biking. This is very important. Drinking plenty of water or other low-calorie or no-calorie drinks. Drink 6-8 glasses of water daily.  How to recognize withdrawal symptoms Your body and mind may experience discomfort as you try to get used to not having nicotine in your system. These effects are called withdrawal symptoms. They may include: Feeling hungrier than normal. Having trouble concentrating. Feeling irritable or restless. Having trouble sleeping. Feeling depressed. Craving a cigarette. To manage withdrawal symptoms: Avoid places, people, and activities that trigger your cravings. Remember why you want to quit. Get plenty of sleep. Avoid coffee and other caffeinated drinks. These may worsen some of your symptoms. These symptoms may surprise you. But be assured that they are normal to havewhen quitting smoking. How to manage cravings Come up with a plan for how to deal with your cravings. The plan should include the following: A definition of the specific situation you want to deal with. An alternative action you will take. A clear idea for how this action will help. The name of someone who might help you with this. Cravings usually last for 5-10 minutes. Consider taking the following actions to help you  with your plan to deal with cravings: Keep your mouth busy. Chew sugar-free gum. Suck on hard candies or a straw. Brush your teeth. Keep your hands and body busy. Change to a different activity right away. Squeeze or play with a ball. Do an activity or a hobby, such as making bead jewelry, practicing needlepoint, or working with wood. Mix up your normal routine. Take a short exercise break. Go for a quick walk or run up and down stairs. Focus on doing something kind or helpful for someone else. Call a friend or family member to talk during a craving. Join a support group. Contact a quitline. Where to find support To get help or find a support group: Call the [facility]'s Smoking Quitline: 1-800-QUIT NOW 5630084720) Visit the website of the Substance Abuse and [place]: ktimeonline.com Text QUIT to SmokefreeTXTAZ:4618977 Where to find more information Visit these websites to find more information on quitting smoking: [place]: www.smokefree.gov American Lung Association: www.lung.org American Cancer Society: www.cancer.org Centers for Disease Control and Prevention: http://www.wolf.info/ American Heart Association: www.heart.org Contact a health care provider if: You want to change your plan for quitting. The medicines you are taking are not helping. Your eating feels out of control or you cannot sleep.  Get help right away if: You feel depressed or become very anxious. Summary Quitting smoking is a physical and mental challenge. You will face cravings, withdrawal symptoms, and temptation to smoke again. Preparation can help you as you go through these challenges. Try different techniques to manage stress, handle social situations, and prevent weight gain. You can deal with cravings by keeping your mouth busy (such as by chewing gum), keeping your hands and body busy, calling family or friends, or contacting a quitline for people who  want to quit smoking. You can deal with withdrawal symptoms by avoiding places where people smoke, getting plenty of rest, and avoiding drinks with caffeine. This information is not intended to replace advice given to you by your health care provider. Make sure you discuss any questions you have with your healthcare provider. Document Revised: 08/06/2019 Document Reviewed: 08/06/2019 Elsevier Patient Education  [place].  High-Fiber Eating Plan Fiber, also called dietary fiber, is a type of carbohydrate. It is found foods such as fruits, vegetables, whole grains, and beans. A high-fiber diet can have many health benefits. Your health care provider may recommend a high-fiber diet to help: Prevent constipation. Fiber can make your bowel movements more regular. Lower your cholesterol. Relieve the following conditions: Inflammation of veins in the anus (hemorrhoids). Inflammation of specific areas of the digestive tract (uncomplicated diverticulosis). A problem of the large intestine, also called the colon, that sometimes causes pain and diarrhea (irritable bowel syndrome, or IBS). Prevent overeating as part of a weight-loss plan. Prevent heart disease, type 2 diabetes, and certain cancers. What are tips for following this plan? Reading food labels  Check the nutrition facts label on food products for the amount of dietary fiber. Choose foods that have 5 grams of fiber or more per serving. The goals for recommended daily fiber intake include: Men (age 61 or younger): 34-38 g. Men (over age 74): 28-34 g. Women (age 22 or younger): 25-28 g. Women (over age 64): 22-25 g. Your daily fiber goal is _____________ g. Shopping Choose whole fruits and vegetables instead of processed forms, such as apple juice or applesauce. Choose a wide variety of high-fiber foods such as avocados, lentils, oats, and kidney beans. Read the nutrition facts label of the foods you choose. Be aware of foods  with added fiber. These foods often have high sugar and sodium amounts per serving. Cooking Use whole-grain flour for baking and cooking. Cook with brown rice instead of white rice. Meal planning Start the day with a breakfast that is high in fiber, such as a cereal that contains 5 g of fiber or more per serving. Eat breads and cereals that are made with whole-grain flour instead of refined flour or white flour. Eat brown rice, bulgur wheat, or millet instead of white rice. Use beans in place of meat in soups, salads, and pasta dishes. Be sure that half of the grains you eat each day are whole grains. General information You can get the recommended daily intake of dietary fiber by: Eating a variety of fruits, vegetables, grains, nuts, and beans. Taking a fiber supplement if you are not able to take in enough fiber in your diet. It is better to get fiber through food than from a supplement. Gradually increase how much fiber you consume. If you increase your intake of dietary fiber too quickly, you may have bloating, cramping, or gas. Drink plenty of water to help you digest fiber. Choose high-fiber snacks, such as berries, raw vegetables, nuts,  and popcorn. What foods should I eat? Fruits Berries. Pears. Apples. Oranges. Avocado. Prunes and raisins. Dried figs. Vegetables Sweet potatoes. Spinach. Kale. Artichokes. Cabbage. Broccoli. Cauliflower.Green peas. Carrots. Squash. Grains Whole-grain breads. Multigrain cereal. Oats and oatmeal. Brown rice. Barley.Bulgur wheat. [place]. Quinoa. Bran muffins. Popcorn. Rye wafer crackers. Meats and other proteins Navy beans, kidney beans, and pinto beans. Soybeans. Split peas. Lentils. Nutsand seeds. Dairy Fiber-fortified yogurt. Beverages Fiber-fortified soy milk. Fiber-fortified orange juice. Other foods Fiber bars. The items listed above may not be a complete list of recommended foods and beverages. Contact a dietitian for more  information. What foods should I avoid? Fruits Fruit juice. Cooked, strained fruit. Vegetables Fried potatoes. Canned vegetables. Well-cooked vegetables. Grains White bread. Pasta made with refined flour. White rice. Meats and other proteins Fatty cuts of meat. Fried chicken or fried fish. Dairy Milk. Yogurt. Cream cheese. Sour cream. Fats and oils Butters. Beverages Soft drinks. Other foods Cakes and pastries. The items listed above may not be a complete list of foods and beverages to avoid. Talk with your dietitian about what choices are best for you. Summary Fiber is a type of carbohydrate. It is found in foods such as fruits, vegetables, whole grains, and beans. A high-fiber diet has many benefits. It can help to prevent constipation, lower blood cholesterol, aid weight loss, and reduce your risk of heart disease, diabetes, and certain cancers. Increase your intake of fiber gradually. Increasing fiber too quickly may cause cramping, bloating, and gas. Drink plenty of water while you increase the amount of fiber you consume. The best sources of fiber include whole fruits and vegetables, whole grains, nuts, seeds, and beans. This information is not intended to replace advice given to you by your health care provider. Make sure you discuss any questions you have with your healthcare provider. Document Revised: 02/20/2020 Document Reviewed: 02/20/2020 Elsevier Patient Education  2022 [employer].

## 2021-08-10 DIAGNOSIS — H04123 Dry eye syndrome of bilateral lacrimal glands: Secondary | ICD-10-CM | POA: Diagnosis not present

## 2021-08-10 DIAGNOSIS — H524 Presbyopia: Secondary | ICD-10-CM | POA: Diagnosis not present

## 2021-09-04 ENCOUNTER — Other Ambulatory Visit: Payer: Self-pay | Admitting: Internal Medicine

## 2021-09-04 DIAGNOSIS — I2510 Atherosclerotic heart disease of native coronary artery without angina pectoris: Secondary | ICD-10-CM

## 2021-09-04 DIAGNOSIS — E7800 Pure hypercholesterolemia, unspecified: Secondary | ICD-10-CM

## 2021-10-19 ENCOUNTER — Other Ambulatory Visit: Payer: Self-pay | Admitting: Internal Medicine

## 2021-10-19 DIAGNOSIS — I2510 Atherosclerotic heart disease of native coronary artery without angina pectoris: Secondary | ICD-10-CM

## 2021-10-19 DIAGNOSIS — E7800 Pure hypercholesterolemia, unspecified: Secondary | ICD-10-CM

## 2021-11-03 ENCOUNTER — Ambulatory Visit (INDEPENDENT_AMBULATORY_CARE_PROVIDER_SITE_OTHER): Payer: BC Managed Care – PPO | Admitting: Internal Medicine

## 2021-11-03 ENCOUNTER — Encounter: Payer: Self-pay | Admitting: Internal Medicine

## 2021-11-03 ENCOUNTER — Other Ambulatory Visit: Payer: Self-pay

## 2021-11-03 VITALS — BP 112/70 | HR 61 | Resp 18 | Ht 72.0 in | Wt 151.4 lb

## 2021-11-03 DIAGNOSIS — Z789 Other specified health status: Secondary | ICD-10-CM

## 2021-11-03 DIAGNOSIS — R310 Gross hematuria: Secondary | ICD-10-CM | POA: Diagnosis not present

## 2021-11-03 DIAGNOSIS — Z720 Tobacco use: Secondary | ICD-10-CM

## 2021-11-03 DIAGNOSIS — F1090 Alcohol use, unspecified, uncomplicated: Secondary | ICD-10-CM

## 2021-11-03 DIAGNOSIS — F419 Anxiety disorder, unspecified: Secondary | ICD-10-CM | POA: Diagnosis not present

## 2021-11-03 NOTE — Progress Notes (Signed)
Patient ID: Sean Morris, male   DOB: [birth year], 51 y.o.   MRN: [id number]        Chief Complaint: follow up blood in urine       HPI:  Sean Morris is a 51 y.o. male here with c/o episode x 1 on 2 days ago; was rushing home but took longer with holding urination, and when finally got home to urinate had to push to get started, felt some sort of "pop" feeling ? With an initial burst of small blood very small volume, the more clearish urine then several bloody drops only at end urination.  Denies urinary symptoms such as dysuria, frequency, urgency, flank pain, or n/v, fever, chills.  Admits to increased urinary frequency with increased urination in the past few months with stress and wondering if had something to do with this. Has urinated several times since that incident without further BRB but wife insisted he come today.  Pt denies chest pain, increased sob or doe, wheezing, orthopnea, PND, increased LE swelling, palpitations, dizziness or syncope.   Pt denies polydipsia, polyuria, or new focal neuro s/s.  Pt still smoking, not ready to quit.  Last ETOH intake was 2 days, denies w/d symtpoms.         Wt Readings from Last 3 Encounters:  11/03/21 151 lb 6.4 oz (68.7 kg)  06/08/21 148 lb (67.1 kg)  07/29/20 146 lb (66.2 kg)   BP Readings from Last 3 Encounters:  11/03/21 112/70  06/08/21 140/82  07/29/20 130/70         Past Medical History:  Diagnosis Date   CAD (coronary artery disease)    s/p 3.5 x 15 mm bare metal stent mid LAD April 2012   GERD (gastroesophageal reflux disease)    Hemorrhoids    History of cardiovascular stress test    ETT-Myoview 8/16: No ischemia or scar, EF 51%; Low Risk   History of echocardiogram    Echo 6/14: Mild LVH, EF 55-65%, normal wall motion   History of echocardiogram    Echo 8/16:  EF 70-35%, normal diastolic function, no RWMA   Hyperlipidemia    Plantar fasciitis    Past Surgical History:  Procedure Laterality Date   CORONARY STENT  PLACEMENT     extraction of wisdom teeth     Plantar fasciitis surgery      reports that he has been smoking. He has a 10.00 pack-year smoking history. He has never used smokeless tobacco. He reports current drug use. Drug: Marijuana. He reports that he does not drink alcohol. family history includes Arthritis in his mother; Cancer (age of onset: 85) in his father; Colon polyps in his mother; Diabetes in his mother; Heart attack in his paternal grandfather; Hypertension in his mother. Allergies  Allergen Reactions   Crestor [Rosuvastatin Calcium] Rash   Pravastatin Itching and Rash   Current Outpatient Medications on File Prior to Visit  Medication Sig Dispense Refill   aspirin EC 81 MG tablet Take 1 tablet (81 mg total) by mouth daily. 90 tablet 1   atorvastatin (LIPITOR) 40 MG tablet TAKE 1 TABLET(40 MG) BY MOUTH DAILY 90 tablet 1   Biotin 1000 MCG tablet Take 1,000 mcg by mouth daily.     fluocinonide cream (LIDEX) 0.09 % Apply 1 application topically 2 (two) times daily. 60 g 1   Multiple Vitamin (MULTIVITAMIN) tablet Take 1 tablet by mouth daily.     VITAMIN D PO Take 25 mg by mouth daily.  No current facility-administered medications on file prior to visit.        ROS:  All others reviewed and negative.  Objective        PE:  BP 112/70    Pulse 61    Resp 18    Ht 6' (1.829 m)    Wt 151 lb 6.4 oz (68.7 kg)    SpO2 98%    BMI 20.53 kg/m                 Constitutional: Pt appears in NAD               HENT: Head: NCAT.                Right Ear: External ear normal.                 Left Ear: External ear normal.                Eyes: . Pupils are equal, round, and reactive to light. Conjunctivae and EOM are normal               Nose: without d/c or deformity               Neck: Neck supple. Gross normal ROM               Cardiovascular: Normal rate and regular rhythm.                 Pulmonary/Chest: Effort normal and breath sounds without rales or wheezing.                 Abd:  Soft, NT, ND, + BS, no organomegaly               Neurological: Pt is alert. At baseline orientation, motor grossly intact               Skin: Skin is warm. No rashes, no other new lesions, LE edema - none               Psychiatric: Pt behavior is normal without agitation   Micro: none  Cardiac tracings I have personally interpreted today:  none  Pertinent Radiological findings (summarize): none   Lab Results  Component Value Date   WBC 6.2 01/07/2020   HGB 14.4 01/07/2020   HCT 43.4 01/07/2020   PLT 258.0 01/07/2020   GLUCOSE 83 01/07/2020   CHOL 182 01/07/2020   TRIG 81.0 01/07/2020   HDL 73.90 01/07/2020   LDLCALC 92 01/07/2020   ALT 12 01/07/2020   AST 23 01/07/2020   NA 139 01/07/2020   K 3.8 01/07/2020   CL 99 01/07/2020   CREATININE 1.06 01/07/2020   BUN 12 01/07/2020   CO2 34 (H) 01/07/2020   TSH 1.92 01/07/2020   PSA 1.63 07/29/2020   INR 0.98 02/07/2011   HGBA1C  02/06/2011    5.5 (NOTE)                                                                       According to the ADA Clinical Practice Recommendations for 2011, when HbA1c is used as a screening test:   >=6.5%   Diagnostic of Diabetes  [place]           (if abnormal result  is confirmed)  5.7-6.4%   Increased risk of developing Diabetes Mellitus  References:Diagnosis and Classification of Diabetes Mellitus,Diabetes ZOXW,9604,54(UJWJX 1):S62-S69 and Standards of Medical Care in         Diabetes - 2011,Diabetes BJYN,8295,62  (Suppl 1):S11-S61.   Assessment/Plan:  Sean Morris is a 51 y.o. Black or African American [2] male with  has a past medical history of CAD (coronary artery disease), GERD (gastroesophageal reflux disease), Hemorrhoids, History of cardiovascular stress test, History of echocardiogram, History of echocardiogram, Hyperlipidemia, and Plantar fasciitis.  Gross hematuria Etiology unclear, suspect likely traumatic blood vessel issue with forceful urination but cant r/o other; for lab  today including ua, cbc, and bmp, also CT abd /pelvis but pt declines urology referral for now as is fearful of idea of cysto when I mention this.    Tobacco abuse Pt counseled to quit, pt not ready  Alcohol use Pt urged to moderate or abstain,  to f/u any worsening symptoms or concerns  Anxiety With increased recent stressors, declines need for further tx or counseling or psychiatry for now  Followup: Return if symptoms worsen or fail to improve.  Cathlean Cower, MD 11/03/2021 8:46 PM [facility]

## 2021-11-03 NOTE — Assessment & Plan Note (Signed)
Pt counseled to quit, pt not ready 

## 2021-11-03 NOTE — Assessment & Plan Note (Signed)
Pt urged to moderate or abstain,  to f/u any worsening symptoms or concerns

## 2021-11-03 NOTE — Assessment & Plan Note (Signed)
With increased recent stressors, declines need for further tx or counseling or psychiatry for now

## 2021-11-03 NOTE — Patient Instructions (Signed)
Please stop smoking  Please continue all other medications as before, and refills have been done if requested.  Please have the pharmacy call with any other refills you may need.  Please keep your appointments with your specialists as you may have planned  You will be contacted regarding the referral for: CT scan  Please call or Mychart message if you change your mind about seeing urology  Please go to the LAB at the blood drawing area for the tests to be done  You will be contacted by phone if any changes need to be made immediately.  Otherwise, you will receive a letter about your results with an explanation, but please check with MyChart first.  Please remember to sign up for MyChart if you have not done so, as this will be important to you in the future with finding out test results, communicating by private email, and scheduling acute appointments online when needed.

## 2021-11-03 NOTE — Assessment & Plan Note (Signed)
Etiology unclear, suspect likely traumatic blood vessel issue with forceful urination but cant r/o other; for lab today including ua, cbc, and bmp, also CT abd /pelvis but pt declines urology referral for now as is fearful of idea of cysto when I mention this.

## 2021-11-04 ENCOUNTER — Encounter: Payer: Self-pay | Admitting: Internal Medicine

## 2021-11-04 LAB — HEPATIC FUNCTION PANEL
ALT: 17 U/L (ref 0–53)
AST: 29 U/L (ref 0–37)
Albumin: 4.3 g/dL (ref 3.5–5.2)
Alkaline Phosphatase: 69 U/L (ref 39–117)
Bilirubin, Direct: 0.1 mg/dL (ref 0.0–0.3)
Total Bilirubin: 0.5 mg/dL (ref 0.2–1.2)
Total Protein: 7.1 g/dL (ref 6.0–8.3)

## 2021-11-04 LAB — URINALYSIS, ROUTINE W REFLEX MICROSCOPIC
Bilirubin Urine: NEGATIVE
Ketones, ur: NEGATIVE
Leukocytes,Ua: NEGATIVE
Nitrite: NEGATIVE
Specific Gravity, Urine: 1.01 (ref 1.000–1.030)
Total Protein, Urine: NEGATIVE
Urine Glucose: NEGATIVE
Urobilinogen, UA: 0.2 (ref 0.0–1.0)
pH: 6.5 (ref 5.0–8.0)

## 2021-11-04 LAB — CBC WITH DIFFERENTIAL/PLATELET
Basophils Absolute: 0 10*3/uL (ref 0.0–0.1)
Basophils Relative: 0.5 % (ref 0.0–3.0)
Eosinophils Absolute: 0.1 10*3/uL (ref 0.0–0.7)
Eosinophils Relative: 1.8 % (ref 0.0–5.0)
HCT: 42.4 % (ref 39.0–52.0)
Hemoglobin: 13.9 g/dL (ref 13.0–17.0)
Lymphocytes Relative: 39.5 % (ref 12.0–46.0)
Lymphs Abs: 2.8 10*3/uL (ref 0.7–4.0)
MCHC: 32.8 g/dL (ref 30.0–36.0)
MCV: 88.7 fl (ref 78.0–100.0)
Monocytes Absolute: 0.5 10*3/uL (ref 0.1–1.0)
Monocytes Relative: 6.4 % (ref 3.0–12.0)
Neutro Abs: 3.7 10*3/uL (ref 1.4–7.7)
Neutrophils Relative %: 51.8 % (ref 43.0–77.0)
Platelets: 245 10*3/uL (ref 150.0–400.0)
RBC: 4.78 Mil/uL (ref 4.22–5.81)
RDW: 15.2 % (ref 11.5–15.5)
WBC: 7.1 10*3/uL (ref 4.0–10.5)

## 2021-11-04 LAB — URINE CULTURE

## 2021-11-04 LAB — BASIC METABOLIC PANEL
BUN: 15 mg/dL (ref 6–23)
CO2: 30 mEq/L (ref 19–32)
Calcium: 9.1 mg/dL (ref 8.4–10.5)
Chloride: 100 mEq/L (ref 96–112)
Creatinine, Ser: 1.21 mg/dL (ref 0.40–1.50)
GFR: 69.49 mL/min (ref >60.00)
Glucose, Bld: 99 mg/dL (ref 70–99)
Potassium: 4.2 mEq/L (ref 3.5–5.1)
Sodium: 137 mEq/L (ref 135–145)

## 2021-11-12 ENCOUNTER — Other Ambulatory Visit: Payer: Self-pay

## 2021-11-12 ENCOUNTER — Ambulatory Visit
Admission: RE | Admit: 2021-11-12 | Discharge: 2021-11-12 | Disposition: A | Discharge status: Home / Self Care | Payer: BC Managed Care – PPO | Source: Ambulatory Visit | Attending: Internal Medicine | Admitting: Internal Medicine

## 2021-11-12 DIAGNOSIS — R310 Gross hematuria: Secondary | ICD-10-CM | POA: Diagnosis not present

## 2021-11-12 DIAGNOSIS — R309 Painful micturition, unspecified: Secondary | ICD-10-CM | POA: Diagnosis not present

## 2021-11-12 DIAGNOSIS — K820 Obstruction of gallbladder: Secondary | ICD-10-CM | POA: Diagnosis not present

## 2021-11-12 DIAGNOSIS — N3289 Other specified disorders of bladder: Secondary | ICD-10-CM | POA: Diagnosis not present

## 2021-11-13 ENCOUNTER — Encounter: Payer: Self-pay | Admitting: Internal Medicine

## 2022-01-18 ENCOUNTER — Telehealth: Payer: Self-pay | Admitting: Internal Medicine

## 2022-01-18 NOTE — Telephone Encounter (Signed)
Type of form received- Know Your Number Health Questionnaire ? ?Form placed in- Provider mailbox ? ?Additional instructions from the patient- Fax when completed to number on back [phone]. Can also call pt to let know when faxed.  ? ? ?Things to remember: ?[place] office: If form received in person, remind patient that forms take 7-10 business days ?CMA should attach charge sheet and put on Supervisor's desk  ?

## 2022-01-18 NOTE — Telephone Encounter (Signed)
LVM stating an OV is needed for form completion ? ?Last OV with PCP was 07/29/2020. Pt is due for a CPE. ?

## 2022-01-19 NOTE — Telephone Encounter (Signed)
NO note needed ?

## 2022-01-24 ENCOUNTER — Other Ambulatory Visit: Payer: Self-pay

## 2022-01-24 ENCOUNTER — Encounter: Payer: Self-pay | Admitting: Internal Medicine

## 2022-01-24 ENCOUNTER — Ambulatory Visit (INDEPENDENT_AMBULATORY_CARE_PROVIDER_SITE_OTHER): Payer: BC Managed Care – PPO

## 2022-01-24 ENCOUNTER — Ambulatory Visit: Payer: BC Managed Care – PPO | Admitting: Internal Medicine

## 2022-01-24 ENCOUNTER — Other Ambulatory Visit: Payer: Self-pay | Admitting: Internal Medicine

## 2022-01-24 VITALS — BP 130/84 | HR 57 | Temp 98.2°F | Resp 16 | Ht 72.0 in | Wt 148.0 lb

## 2022-01-24 DIAGNOSIS — E785 Hyperlipidemia, unspecified: Secondary | ICD-10-CM

## 2022-01-24 DIAGNOSIS — M25551 Pain in right hip: Secondary | ICD-10-CM | POA: Diagnosis not present

## 2022-01-24 DIAGNOSIS — G8929 Other chronic pain: Secondary | ICD-10-CM

## 2022-01-24 DIAGNOSIS — I2510 Atherosclerotic heart disease of native coronary artery without angina pectoris: Secondary | ICD-10-CM | POA: Diagnosis not present

## 2022-01-24 DIAGNOSIS — R001 Bradycardia, unspecified: Secondary | ICD-10-CM | POA: Diagnosis not present

## 2022-01-24 DIAGNOSIS — Z23 Encounter for immunization: Secondary | ICD-10-CM

## 2022-01-24 DIAGNOSIS — Z720 Tobacco use: Secondary | ICD-10-CM

## 2022-01-24 DIAGNOSIS — Z0000 Encounter for general adult medical examination without abnormal findings: Secondary | ICD-10-CM

## 2022-01-24 LAB — LIPID PANEL
Cholesterol: 161 mg/dL (ref 0–200)
HDL: 77.8 mg/dL (ref >39.00)
LDL Cholesterol: 75 mg/dL (ref 0–99)
NonHDL: 83.2
Total CHOL/HDL Ratio: 2
Triglycerides: 40 mg/dL (ref 0.0–149.0)
VLDL: 8 mg/dL (ref 0.0–40.0)

## 2022-01-24 MED ORDER — ASPIRIN EC 81 MG PO TBEC: 81.0000 mg | DELAYED_RELEASE_TABLET | Freq: Every day | ORAL | 1 refills | Status: AC

## 2022-01-24 NOTE — Progress Notes (Signed)
? ?Subjective:  ?Patient ID: Sean Morris, male    DOB: 08-Apr-1971  Age: 51 y.o. MRN: [id number] ? ?CC: Annual Exam and Hyperlipidemia ? ?This visit occurred during the SARS-CoV-2 public health emergency.  Safety protocols were in place, including screening questions prior to the visit, additional usage of staff PPE, and extensive cleaning of exam room while observing appropriate contact time as indicated for disinfecting solutions.   ? ?HPI ?Sean Morris presents for a CPX and f/up -  ? ?He complains of a 3-week history of right posterior hip pain with no history of trauma or injury.  The pain does not radiate.  He is active and denies dizziness, lightheadedness, near syncope, chest pain, shortness of breath, diaphoresis, or edema. ? ?Outpatient Medications Prior to Visit  ?Medication Sig Dispense Refill  ? atorvastatin (LIPITOR) 40 MG tablet TAKE 1 TABLET(40 MG) BY MOUTH DAILY 90 tablet 1  ? Biotin 1000 MCG tablet Take 1,000 mcg by mouth daily.    ? fluocinonide cream (LIDEX) 6.60 % Apply 1 application topically 2 (two) times daily. 60 g 1  ? Multiple Vitamin (MULTIVITAMIN) tablet Take 1 tablet by mouth daily.    ? VITAMIN D PO Take 25 mg by mouth daily.    ? aspirin EC 81 MG tablet Take 1 tablet (81 mg total) by mouth daily. 90 tablet 1  ? ?No facility-administered medications prior to visit.  ? ? ?ROS ?Review of Systems  ?Constitutional:  Negative for appetite change, diaphoresis, fatigue and unexpected weight change.  ?HENT: Negative.    ?Eyes: Negative.   ?Respiratory:  Negative for cough, chest tightness, shortness of breath and wheezing.   ?Cardiovascular:  Negative for chest pain, palpitations and leg swelling.  ?Gastrointestinal:  Negative for abdominal pain, constipation and diarrhea.  ?Endocrine: Negative.   ?Genitourinary: Negative.  Negative for difficulty urinating and hematuria.  ?Musculoskeletal:  Positive for arthralgias. Negative for back pain, myalgias and neck pain.  ?Skin: Negative.    ?Neurological: Negative.  Negative for dizziness, syncope, weakness, light-headedness and headaches.  ?Hematological:  Negative for adenopathy. Does not bruise/bleed easily.  ?Psychiatric/Behavioral: Negative.    ? ?Objective:  ?BP 130/84 (BP Location: Right Arm, Patient Position: Sitting, Cuff Size: Large)   Pulse (!) 57   Temp 98.2 ?F (36.8 ?C) (Oral)   Resp 16   Ht 6' (1.829 m)   Wt 148 lb (67.1 kg)   SpO2 99%   BMI 20.07 kg/m?  ? ?BP Readings from Last 3 Encounters:  ?01/24/22 130/84  ?11/03/21 112/70  ?06/08/21 140/82  ? ? ?Wt Readings from Last 3 Encounters:  ?01/24/22 148 lb (67.1 kg)  ?11/03/21 151 lb 6.4 oz (68.7 kg)  ?06/08/21 148 lb (67.1 kg)  ? ? ?Physical Exam ?Vitals reviewed.  ?HENT:  ?   Nose: Nose normal.  ?   Mouth/Throat:  ?   Mouth: Mucous membranes are moist.  ?Eyes:  ?   General: No scleral icterus. ?   Conjunctiva/sclera: Conjunctivae normal.  ?Cardiovascular:  ?   Rate and Rhythm: Regular rhythm. Bradycardia present.  ?   Heart sounds: Normal heart sounds, S1 normal and S2 normal. No murmur heard. ?  No friction rub. No gallop.  ?   Comments: EKG- ?SB, 53 bpm ??LAE ?No LVH or Q waves ?Pulmonary:  ?   Effort: Pulmonary effort is normal.  ?   Breath sounds: Normal breath sounds.  ?Abdominal:  ?   General: Abdomen is flat.  ?   Palpations: There  is no mass.  ?   Tenderness: There is no abdominal tenderness. There is no guarding.  ?   Hernia: No hernia is present. There is no hernia in the left inguinal area or right inguinal area.  ?Genitourinary: ?   Pubic Area: No rash.   ?   Penis: Normal and circumcised.   ?   Testes: Normal.  ?   Epididymis:  ?   Right: Normal.  ?   Left: Normal.  ?   Prostate: Normal. Not enlarged, not tender and no nodules present.  ?   Rectum: Normal. Guaiac result negative. No mass, tenderness, anal fissure, external hemorrhoid or internal hemorrhoid. Normal anal tone.  ?Musculoskeletal:     ?   General: No swelling. Normal range of motion.  ?   Lumbar back:  Normal. No edema, deformity, tenderness or bony tenderness. Normal range of motion. Negative right straight leg raise test and negative left straight leg raise test.  ?   Right hip: Normal. No deformity, tenderness or bony tenderness. Normal range of motion.  ?   Left hip: Normal.  ?   Right lower leg: No swelling. No edema.  ?   Left lower leg: No swelling. No edema.  ?Lymphadenopathy:  ?   Lower Body: No right inguinal adenopathy. No left inguinal adenopathy.  ?Neurological:  ?   Mental Status: He is alert.  ? ? ?Lab Results  ?Component Value Date  ? WBC 7.1 11/03/2021  ? HGB 13.9 11/03/2021  ? HCT 42.4 11/03/2021  ? PLT 245.0 11/03/2021  ? GLUCOSE 99 11/03/2021  ? CHOL 161 01/24/2022  ? TRIG 40.0 01/24/2022  ? HDL 77.80 01/24/2022  ? [place] 75 01/24/2022  ? ALT 17 11/03/2021  ? AST 29 11/03/2021  ? NA 137 11/03/2021  ? K 4.2 11/03/2021  ? CL 100 11/03/2021  ? CREATININE 1.21 11/03/2021  ? BUN 15 11/03/2021  ? CO2 30 11/03/2021  ? TSH 2.27 01/24/2022  ? PSA 1.15 01/24/2022  ? INR 0.98 02/07/2011  ? HGBA1C  02/06/2011  ?  5.5 ?(NOTE)                                                                       According to the ADA Clinical Practice Recommendations for 2011, when HbA1c is used as a screening test:   >=6.5%   Diagnostic of Diabetes Mellitus           (if abnormal result ? is confirmed)  5.7-6.4%   Increased risk of developing Diabetes Mellitus  References:Diagnosis and Classification of Diabetes Mellitus,Diabetes QHUT,6546,50(PTWSF 1):S62-S69 and Standards of Medical Care in         Diabetes - 2011,Diabetes KCLE,7517,00  ?(Suppl 1):S11-S61.  ? ? ?CT RENAL STONE STUDY ? ?Result Date: 11/13/2021 ?CLINICAL DATA:  gross hematuria. Painful urination with gross hematuria, once episode last week, symptoms have subsided now. EXAM: CT ABDOMEN AND PELVIS WITHOUT CONTRAST TECHNIQUE: Multidetector CT imaging of the abdomen and pelvis was performed following the standard protocol without IV contrast. RADIATION DOSE  REDUCTION: This exam was performed according to the departmental dose-optimization program which includes automated exposure control, adjustment of the mA and/or kV according to patient size and/or use of iterative reconstruction technique. COMPARISON:  None. FINDINGS: Lower chest: No acute abnormality. Hepatobiliary: No focal liver abnormality. The gallbladder is contracted. No gallstones, gallbladder wall thickening, or pericholecystic fluid. No biliary dilatation. Pancreas: No focal lesion. Normal pancreatic contour. No surrounding inflammatory changes. No main pancreatic ductal dilatation. Spleen: Normal in size without focal abnormality. Adrenals/Urinary Tract: No adrenal nodule bilaterally. No nephrolithiasis and no hydronephrosis. No definite contour-deforming renal mass. No ureterolithiasis or hydroureter. Circumferential urinary bladder wall thickening in the setting of a completely decompressed urinary bladder. Stomach/Bowel: Stomach is within normal limits. No evidence of bowel wall thickening or dilatation. Appendix appears normal. Vascular/Lymphatic: No abdominal aorta or iliac aneurysm. Mild atherosclerotic plaque of the aorta and its branches. No abdominal, pelvic, or inguinal lymphadenopathy. Reproductive: Prostate is unremarkable. Other: No intraperitoneal free fluid. No intraperitoneal free gas. No organized fluid collection. Musculoskeletal: No abdominal wall hernia or abnormality. No suspicious lytic or blastic osseous lesions. No acute displaced fracture. Multilevel degenerative changes of the spine with intervertebral disc space vacuum phenomenon at the L4-L5 L5-S1 levels. IMPRESSION: Circumferential urinary bladder wall thickening in the setting of a completely decompressed urinary bladder. Otherwise no acute intra-abdominal or intrapelvic abnormality with limited evaluation on this noncontrast study. Electronically Signed   By: Iven Finn M.D.   On: 11/13/2021 22:23  ? ?CT RENAL STONE  STUDY ? ?Result Date: 11/13/2021 ?CLINICAL DATA:  gross hematuria. Painful urination with gross hematuria, once episode last week, symptoms have subsided now. EXAM: CT ABDOMEN AND PELVIS WITHOUT CONTRAST TECHNIQUE: Multidetec

## 2022-01-24 NOTE — Patient Instructions (Signed)

## 2022-01-25 ENCOUNTER — Encounter: Payer: Self-pay | Admitting: Internal Medicine

## 2022-01-25 LAB — TSH: TSH: 2.27 u[IU]/mL (ref 0.35–5.50)

## 2022-01-25 LAB — PSA: PSA: 1.15 ng/mL (ref 0.10–4.00)

## 2022-01-27 NOTE — Telephone Encounter (Signed)
Physical form completed and faxed. ?

## 2022-01-30 MED ORDER — SHINGRIX 50 MCG/0.5ML IM SUSR: 0.5000 mL | Freq: Once | INTRAMUSCULAR | 1 refills | Status: AC

## 2022-03-15 ENCOUNTER — Encounter: Payer: Self-pay | Admitting: Gastroenterology

## 2022-03-26 ENCOUNTER — Other Ambulatory Visit: Payer: Self-pay | Admitting: Internal Medicine

## 2022-03-26 DIAGNOSIS — L280 Lichen simplex chronicus: Secondary | ICD-10-CM

## 2022-06-20 ENCOUNTER — Other Ambulatory Visit: Payer: Self-pay | Admitting: Internal Medicine

## 2022-06-20 DIAGNOSIS — I2510 Atherosclerotic heart disease of native coronary artery without angina pectoris: Secondary | ICD-10-CM

## 2022-06-20 DIAGNOSIS — E7800 Pure hypercholesterolemia, unspecified: Secondary | ICD-10-CM

## 2022-09-13 ENCOUNTER — Ambulatory Visit: Payer: Self-pay | Attending: Cardiovascular Disease | Admitting: Cardiovascular Disease

## 2022-09-13 NOTE — Progress Notes (Deleted)
No chief complaint on file.  History of Present Illness: 51 yo male with history of tobacco abuse, hyperlipidemia and CAD here today for cardiac follow up. He was admitted to [facility] 02/07/11 with unstable angina and was found to have severe mid LAD stenosis. A 3.5 x 15 mm Vision bare metal stent was placed in the mid LAD. The other vessels were free of disease. LV function was normal. Echo August 2016 with normal LV function. Exercise nuclear stress test August 2016 with no ischemia. He has had a rash with Pravastatin but has tolerated Lipitor. He was last seen in our office in January 2018.   He is here today for follow up. The patient denies any chest pain, dyspnea, palpitations, lower extremity edema, orthopnea, PND, dizziness, near syncope or syncope.   Primary Care Physician: Janith Lima, MD  Past Medical History:  Diagnosis Date   CAD (coronary artery disease)    s/p 3.5 x 15 mm bare metal stent mid LAD April 2012   GERD (gastroesophageal reflux disease)    Hemorrhoids    History of cardiovascular stress test    ETT-Myoview 8/16: No ischemia or scar, EF 51%; Low Risk   History of echocardiogram    Echo 6/14: Mild LVH, EF 55-65%, normal wall motion   History of echocardiogram    Echo 8/16:  EF 47-82%, normal diastolic function, no RWMA   Hyperlipidemia    Plantar fasciitis     Past Surgical History:  Procedure Laterality Date   CORONARY STENT PLACEMENT     extraction of wisdom teeth     Plantar fasciitis surgery      Current Outpatient Medications  Medication Sig Dispense Refill   aspirin EC 81 MG tablet Take 1 tablet (81 mg total) by mouth daily. 90 tablet 1   atorvastatin (LIPITOR) 40 MG tablet TAKE 1 TABLET(40 MG) BY MOUTH DAILY 90 tablet 0   Biotin 1000 MCG tablet Take 1,000 mcg by mouth daily.     fluocinonide cream (LIDEX) 0.05 % APPLY TOPICALLY TO THE AFFECTED AREA TWICE DAILY 60 g 1   Multiple Vitamin (MULTIVITAMIN) tablet Take 1 tablet by mouth  daily.     VITAMIN D PO Take 25 mg by mouth daily.     No current facility-administered medications for this visit.    Allergies  Allergen Reactions   Crestor [Rosuvastatin Calcium] Rash   Pravastatin Itching and Rash    Social History   Socioeconomic History   Marital status: Married    Spouse name: Not on file   Number of children: Not on file   Years of education: Not on file   Highest education level: Not on file  Occupational History   Occupation: Transportation     Comment: Pharmacist, community   Tobacco Use   Smoking status: Every Day    Packs/day: 0.50    Years: 20.00    Total pack years: 10.00    Types: Cigarettes   Smokeless tobacco: Never   Tobacco comments:    pt stopped two days ago  Substance and Sexual Activity   Alcohol use: No    Comment: 1-2 drinks daily    Drug use: Yes    Types: Marijuana   Sexual activity: Yes    Partners: Female  Other Topics Concern   Not on file  Social History Narrative   Daily caffeine    Social Determinants of Health   Financial Resource Strain: Not on file  Food  Insecurity: Not on file  Transportation Needs: Not on file  Physical Activity: Not on file  Stress: Not on file  Social Connections: Not on file  Intimate Partner Violence: Not on file    Family History  Problem Relation Age of Onset   Diabetes Mother    Hypertension Mother    Arthritis Mother    Colon polyps Mother    Cancer Father 8       prostate   Heart attack Paternal Grandfather    Alcohol abuse Neg Hx    Stroke Neg Hx    Heart disease Neg Hx    Hyperlipidemia Neg Hx    Kidney disease Neg Hx    Colon cancer Neg Hx    Esophageal cancer Neg Hx    Rectal cancer Neg Hx    Stomach cancer Neg Hx     Review of Systems:  As stated in the HPI and otherwise negative.   There were no vitals taken for this visit.  Physical Examination: General: Well developed, well nourished, NAD  HEENT: OP clear, mucus membranes moist  SKIN: warm,  dry. No rashes. Neuro: No focal deficits  Musculoskeletal: Muscle strength 5/5 all ext  Psychiatric: Mood and affect normal  Neck: No JVD, no carotid bruits, no thyromegaly, no lymphadenopathy.  Lungs:Clear bilaterally, no wheezes, rhonci, crackles Cardiovascular: Regular rate and rhythm. No murmurs, gallops or rubs. Abdomen:Soft. Bowel sounds present. Non-tender.  Extremities: No lower extremity edema. Pulses are 2 + in the bilateral DP/PT.  Echo August 2016: Left ventricle: The cavity size was normal. Wall thickness was   normal. Systolic function was normal. The estimated ejection   fraction was in the range of 50% to 55%. Wall motion was normal;   there were no regional wall motion abnormalities. Left   ventricular diastolic function parameters were normal. Impressions: - Normal LV systolic and diastolic function; trace MR and TR  EKG:  EKG is *** ordered today. The ekg ordered today demonstrates ***   Recent Labs: 11/03/2021: ALT 17; BUN 15; Creatinine, Ser 1.21; Hemoglobin 13.9; Platelets 245.0; Potassium 4.2; Sodium 137 01/24/2022: TSH 2.27   Lipid Panel    Component Value Date/Time   CHOL 161 01/24/2022 1528   TRIG 40.0 01/24/2022 1528   HDL 77.80 01/24/2022 1528   CHOLHDL 2 01/24/2022 1528   VLDL 8.0 01/24/2022 1528   LDLCALC 75 01/24/2022 1528     Wt Readings from Last 3 Encounters:  01/24/22 148 lb (67.1 kg)  11/03/21 151 lb 6.4 oz (68.7 kg)  06/08/21 148 lb (67.1 kg)    Assessment and Plan:   1. CAD without angina: He has no chest pain. He has not been on a beta blocker secondary to bradycardia. Continue statin and ASA.   2. HLD: LDL near goal in March 2023. Continue statin.    Labs/ tests ordered today include:  No orders of the defined types were placed in this encounter.  Disposition:   F/U with me in 12  months  Signed, Lauree Chandler, MD 09/13/2022 8:21 AM    [facility] [place], [place], [place]   [address] Phone: [phone]; Fax: [phone]

## 2022-09-13 NOTE — Telephone Encounter (Signed)
Left message for patient to call back regarding his appointment.  He did not arrive for his 8:40 am appointment today.  The message he was sent about rescheduling has not been read so he may still be planning for tomorrow am at 8:20 am.   If he calls back, I will need to move him to later in the day tomorrow.

## 2022-09-14 ENCOUNTER — Ambulatory Visit: Payer: BC Managed Care – PPO | Admitting: Cardiovascular Disease

## 2022-10-16 ENCOUNTER — Other Ambulatory Visit: Payer: Self-pay | Admitting: Internal Medicine

## 2022-10-16 DIAGNOSIS — I2510 Atherosclerotic heart disease of native coronary artery without angina pectoris: Secondary | ICD-10-CM

## 2022-10-16 DIAGNOSIS — E7800 Pure hypercholesterolemia, unspecified: Secondary | ICD-10-CM

## 2022-10-27 IMAGING — DX DG HIP (WITH OR WITHOUT PELVIS) 2-3V*R*
3 series · 3 of 3 positions shown · non-contrast
Comparison: CT abdomen and pelvis 11/12/2021

CLINICAL DATA: Pain for 3 weeks.  Right hip pain.

EXAM:
DG HIP (WITH OR WITHOUT PELVIS) 2-3V RIGHT

[pelvis ap]
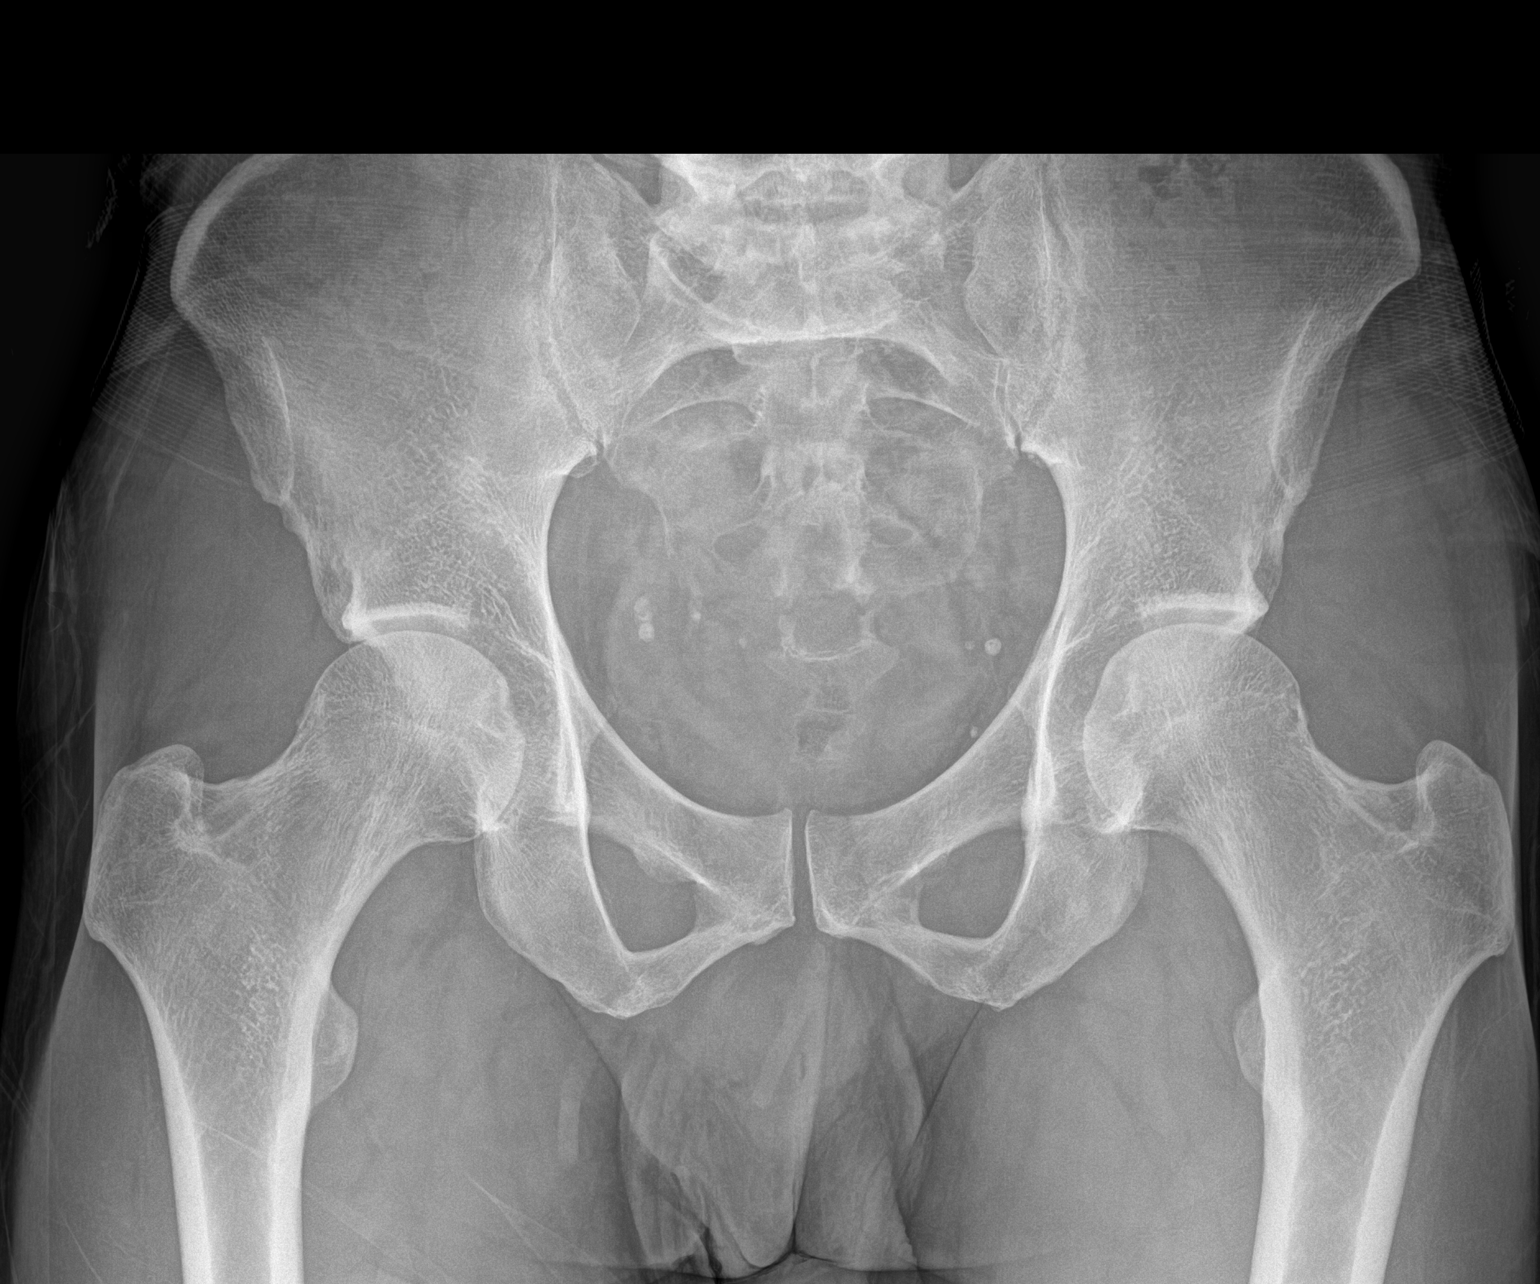

[hip ap]
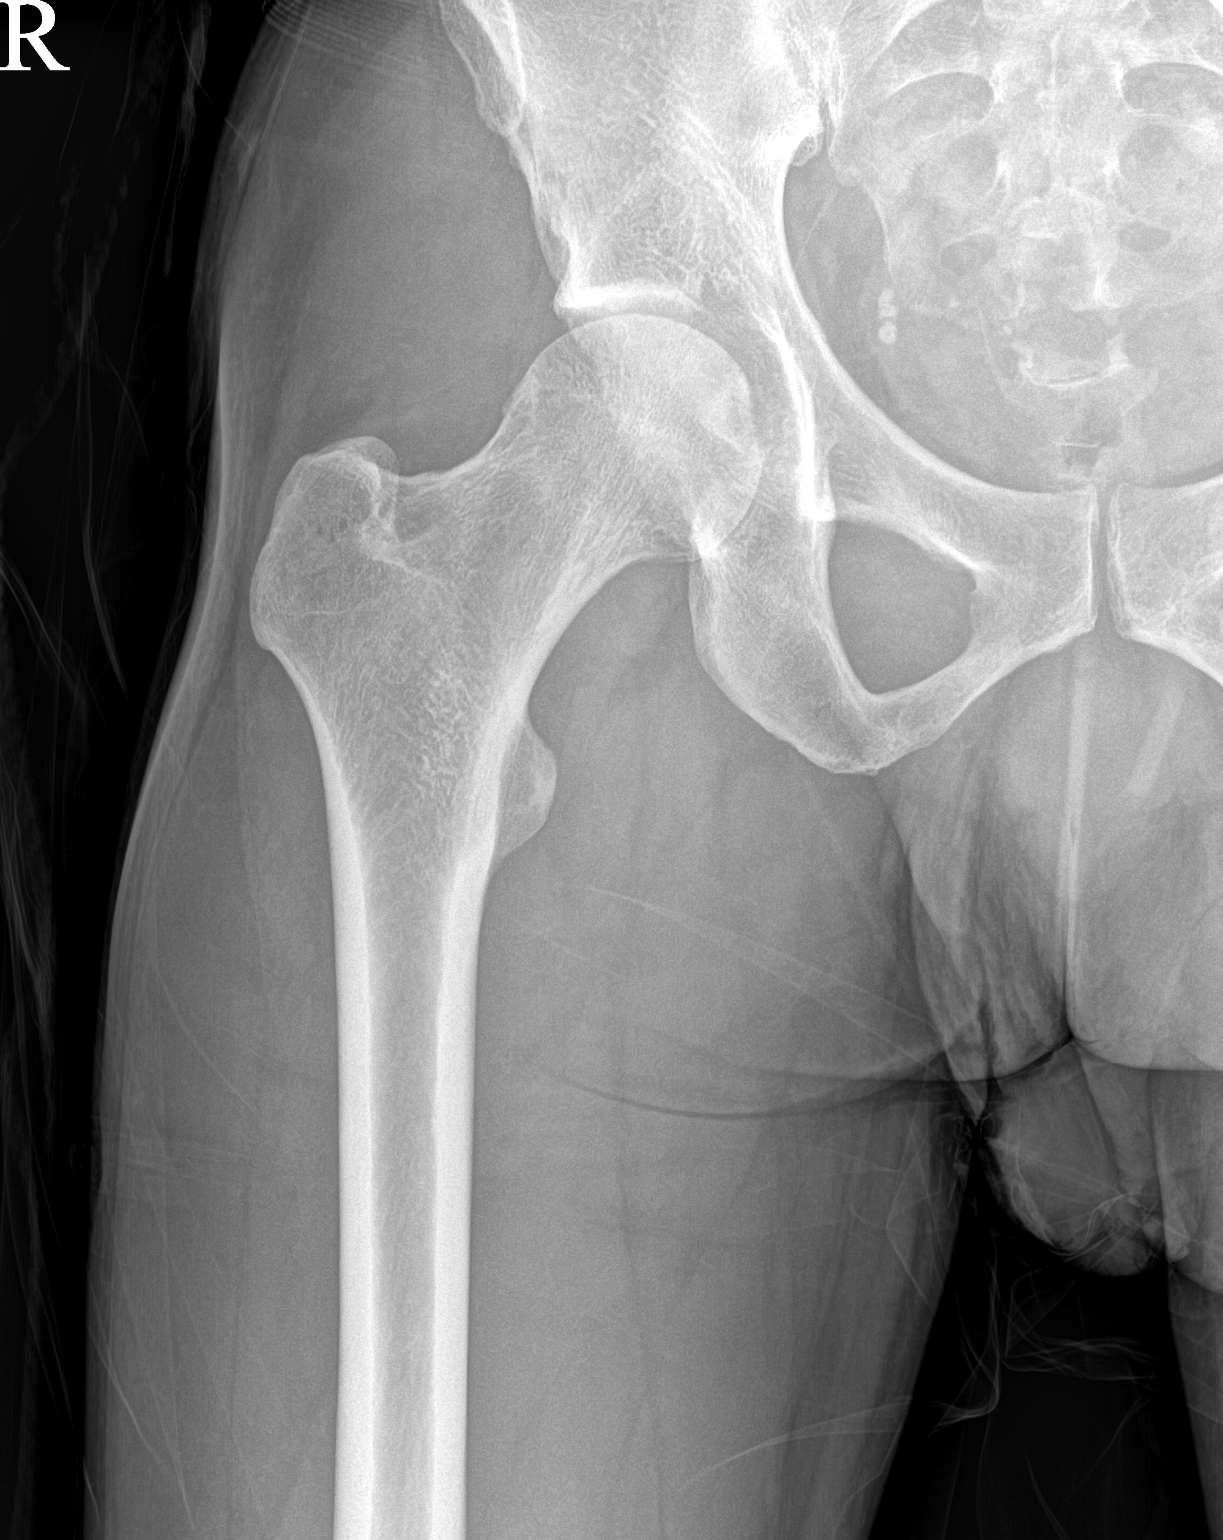

[hip frog leg]
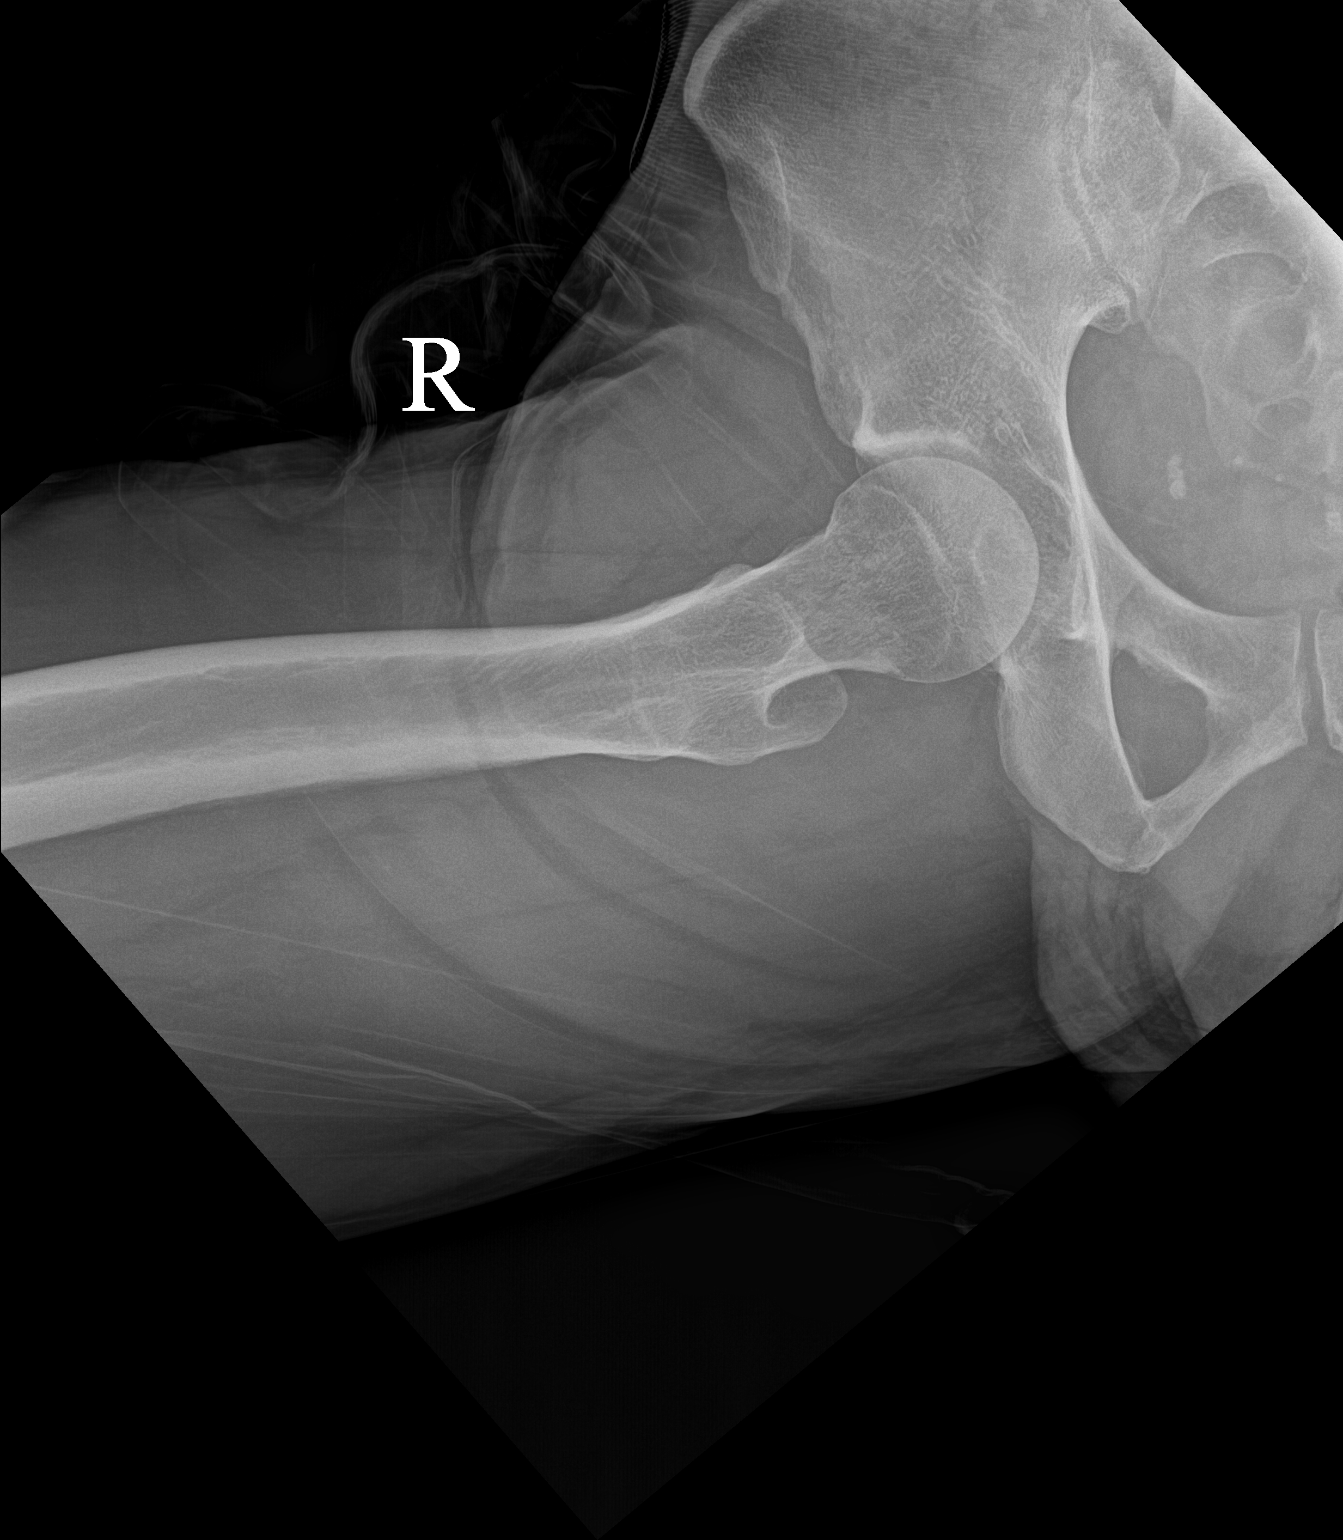

[3 of 3 positions shown; findings below may reference images not displayed]

FINDINGS: The bilateral sacroiliac, bilateral femoroacetabular, and pubic
symphysis joint spaces are maintained. No acute fracture or
dislocation. Vascular phleboliths again overlie the pelvis.
IMPRESSION: No significant osteoarthritis.

## 2023-02-28 ENCOUNTER — Other Ambulatory Visit: Payer: Self-pay | Admitting: Internal Medicine

## 2023-02-28 DIAGNOSIS — I2510 Atherosclerotic heart disease of native coronary artery without angina pectoris: Secondary | ICD-10-CM

## 2023-02-28 DIAGNOSIS — E7800 Pure hypercholesterolemia, unspecified: Secondary | ICD-10-CM

## 2023-10-23 ENCOUNTER — Other Ambulatory Visit: Payer: Self-pay | Admitting: Internal Medicine

## 2023-10-23 DIAGNOSIS — I2510 Atherosclerotic heart disease of native coronary artery without angina pectoris: Secondary | ICD-10-CM

## 2023-10-23 DIAGNOSIS — E7800 Pure hypercholesterolemia, unspecified: Secondary | ICD-10-CM
